# Patient Record
Sex: Female | Born: 1970
Health system: Southern US, Community
[De-identification: ages and names within clinical notes are randomized; demographics above are authoritative.]

## PROBLEM LIST (undated history)

## (undated) DIAGNOSIS — D6861 Antiphospholipid syndrome: Secondary | ICD-10-CM

## (undated) DIAGNOSIS — G4730 Sleep apnea, unspecified: Secondary | ICD-10-CM

## (undated) DIAGNOSIS — I639 Cerebral infarction, unspecified: Secondary | ICD-10-CM

## (undated) DIAGNOSIS — M797 Fibromyalgia: Secondary | ICD-10-CM

## (undated) DIAGNOSIS — Z765 Malingerer [conscious simulation]: Secondary | ICD-10-CM

## (undated) DIAGNOSIS — F319 Bipolar disorder, unspecified: Secondary | ICD-10-CM

## (undated) HISTORY — PX: TONSILLECTOMY: SUR1361

## (undated) HISTORY — PX: TUBAL LIGATION: SHX77

---

## 1997-11-23 ENCOUNTER — Encounter: Admission: RE | Admit: 1997-11-23 | Discharge: 1998-02-21 | Payer: Self-pay | Admitting: *Deleted

## 1998-01-16 ENCOUNTER — Encounter: Admission: RE | Admit: 1998-01-16 | Discharge: 1998-04-16 | Payer: Self-pay | Admitting: Orthopedic Surgery

## 1998-04-10 ENCOUNTER — Encounter: Admission: RE | Admit: 1998-04-10 | Discharge: 1998-07-09 | Payer: Self-pay | Admitting: Orthopedic Surgery

## 1999-03-08 ENCOUNTER — Encounter: Admission: RE | Admit: 1999-03-08 | Discharge: 1999-03-22 | Payer: Self-pay | Admitting: *Deleted

## 1999-09-25 ENCOUNTER — Encounter: Payer: Self-pay | Admitting: Emergency Medicine

## 1999-09-25 ENCOUNTER — Emergency Department (HOSPITAL_COMMUNITY): Admission: EM | Admit: 1999-09-25 | Discharge: 1999-09-25 | Payer: Self-pay | Admitting: Emergency Medicine

## 2000-03-05 ENCOUNTER — Encounter: Admission: RE | Admit: 2000-03-05 | Discharge: 2000-03-05 | Payer: Self-pay | Admitting: Family Medicine

## 2000-03-05 ENCOUNTER — Encounter: Payer: Self-pay | Admitting: Family Medicine

## 2000-06-12 ENCOUNTER — Encounter: Admission: RE | Admit: 2000-06-12 | Discharge: 2000-07-10 | Payer: Self-pay | Admitting: Podiatry

## 2000-08-10 ENCOUNTER — Encounter: Admission: RE | Admit: 2000-08-10 | Discharge: 2000-11-08 | Payer: Self-pay | Admitting: Podiatry

## 2000-12-11 ENCOUNTER — Encounter: Payer: Self-pay | Admitting: *Deleted

## 2000-12-11 ENCOUNTER — Encounter: Admission: RE | Admit: 2000-12-11 | Discharge: 2000-12-11 | Payer: Self-pay | Admitting: *Deleted

## 2001-03-30 ENCOUNTER — Ambulatory Visit (HOSPITAL_COMMUNITY): Admission: RE | Admit: 2001-03-30 | Discharge: 2001-03-30 | Payer: Self-pay | Admitting: *Deleted

## 2001-03-30 ENCOUNTER — Encounter: Payer: Self-pay | Admitting: *Deleted

## 2001-05-09 ENCOUNTER — Emergency Department (HOSPITAL_COMMUNITY): Admission: EM | Admit: 2001-05-09 | Discharge: 2001-05-09 | Payer: Self-pay | Admitting: Emergency Medicine

## 2001-10-01 ENCOUNTER — Encounter: Admission: RE | Admit: 2001-10-01 | Discharge: 2001-10-01 | Payer: Self-pay | Admitting: Family Medicine

## 2001-10-01 ENCOUNTER — Encounter: Payer: Self-pay | Admitting: Family Medicine

## 2002-03-18 ENCOUNTER — Encounter: Admission: RE | Admit: 2002-03-18 | Discharge: 2002-03-18 | Payer: Self-pay | Admitting: Family Medicine

## 2002-03-18 ENCOUNTER — Encounter: Payer: Self-pay | Admitting: Family Medicine

## 2005-04-30 ENCOUNTER — Ambulatory Visit: Payer: Self-pay | Admitting: Psychiatry

## 2005-04-30 ENCOUNTER — Inpatient Hospital Stay (HOSPITAL_COMMUNITY): Admission: RE | Admit: 2005-04-30 | Discharge: 2005-05-05 | Payer: Self-pay | Admitting: Psychiatry

## 2005-11-23 ENCOUNTER — Emergency Department (HOSPITAL_COMMUNITY): Admission: EM | Admit: 2005-11-23 | Discharge: 2005-11-23 | Payer: Self-pay | Admitting: Family Medicine

## 2006-02-02 ENCOUNTER — Other Ambulatory Visit: Admission: RE | Admit: 2006-02-02 | Discharge: 2006-02-02 | Payer: Self-pay | Admitting: Family Medicine

## 2006-04-17 ENCOUNTER — Emergency Department (HOSPITAL_COMMUNITY): Admission: EM | Admit: 2006-04-17 | Discharge: 2006-04-17 | Payer: Self-pay | Admitting: Emergency Medicine

## 2007-11-05 ENCOUNTER — Ambulatory Visit (HOSPITAL_COMMUNITY): Admission: RE | Admit: 2007-11-05 | Discharge: 2007-11-05 | Payer: Self-pay | Admitting: Orthopedic Surgery

## 2008-07-30 ENCOUNTER — Emergency Department (HOSPITAL_COMMUNITY): Admission: EM | Admit: 2008-07-30 | Discharge: 2008-07-30 | Payer: Self-pay | Admitting: Emergency Medicine

## 2009-08-31 ENCOUNTER — Emergency Department (HOSPITAL_COMMUNITY): Admission: EM | Admit: 2009-08-31 | Discharge: 2009-08-31 | Payer: Self-pay | Admitting: Family Medicine

## 2009-11-19 ENCOUNTER — Ambulatory Visit: Payer: Self-pay | Admitting: Family Medicine

## 2009-12-12 ENCOUNTER — Encounter (INDEPENDENT_AMBULATORY_CARE_PROVIDER_SITE_OTHER): Payer: Self-pay | Admitting: Family Medicine

## 2009-12-12 ENCOUNTER — Ambulatory Visit: Payer: Self-pay | Admitting: Internal Medicine

## 2009-12-12 LAB — CONVERTED CEMR LAB
Alkaline Phosphatase: 42 units/L (ref 39–117)
BUN: 21 mg/dL (ref 6–23)
Basophils Absolute: 0 10*3/uL (ref 0.0–0.1)
Basophils Relative: 0 % (ref 0–1)
Cholesterol: 225 mg/dL — ABNORMAL HIGH (ref 0–200)
Creatinine, Ser: 0.87 mg/dL (ref 0.40–1.20)
Eosinophils Absolute: 0.4 10*3/uL (ref 0.0–0.7)
Glucose, Bld: 84 mg/dL (ref 70–99)
HDL: 57 mg/dL (ref >39)
Hemoglobin: 13 g/dL (ref 12.0–15.0)
LDL Cholesterol: 142 mg/dL — ABNORMAL HIGH (ref 0–99)
MCHC: 32.3 g/dL (ref 30.0–36.0)
MCV: 97.3 fL (ref 78.0–100.0)
Monocytes Absolute: 0.6 10*3/uL (ref 0.1–1.0)
Neutro Abs: 4.7 10*3/uL (ref 1.7–7.7)
RDW: 13.8 % (ref 11.5–15.5)
Sodium: 136 meq/L (ref 135–145)
Total Bilirubin: 0.2 mg/dL — ABNORMAL LOW (ref 0.3–1.2)
Total CHOL/HDL Ratio: 3.9
Total Protein: 6.5 g/dL (ref 6.0–8.3)
Triglycerides: 129 mg/dL (ref <150)
VLDL: 26 mg/dL (ref 0–40)

## 2009-12-20 ENCOUNTER — Ambulatory Visit: Payer: Self-pay | Admitting: Internal Medicine

## 2010-12-12 ENCOUNTER — Emergency Department (HOSPITAL_COMMUNITY)
Admission: EM | Admit: 2010-12-12 | Discharge: 2010-12-12 | Disposition: A | Discharge status: Home / Self Care | Payer: Self-pay | Attending: Emergency Medicine | Admitting: Emergency Medicine

## 2010-12-12 ENCOUNTER — Emergency Department (HOSPITAL_COMMUNITY): Payer: Self-pay

## 2010-12-12 DIAGNOSIS — S92309A Fracture of unspecified metatarsal bone(s), unspecified foot, initial encounter for closed fracture: Secondary | ICD-10-CM | POA: Insufficient documentation

## 2010-12-12 DIAGNOSIS — Y92009 Unspecified place in unspecified non-institutional (private) residence as the place of occurrence of the external cause: Secondary | ICD-10-CM | POA: Insufficient documentation

## 2010-12-12 DIAGNOSIS — W108XXA Fall (on) (from) other stairs and steps, initial encounter: Secondary | ICD-10-CM | POA: Insufficient documentation

## 2010-12-12 DIAGNOSIS — M79609 Pain in unspecified limb: Secondary | ICD-10-CM | POA: Insufficient documentation

## 2011-01-07 NOTE — Op Note (Signed)
NAME:  Andrea [name], Andrea [name]               ACCOUNT NO.:  [id number]   MEDICAL RECORD NO.:  [id number]          PATIENT TYPE:  AMB   LOCATION:  SDS                          FACILITY:  [facility]   PHYSICIAN:  Artist Pais. Weingold, M.D.DATE OF BIRTH:  November 30, 1970   DATE OF PROCEDURE:  11/05/2007  DATE OF DISCHARGE:                               OPERATIVE REPORT   PREOPERATIVE DIAGNOSIS:  Left thumb mucous cyst.   POSTOPERATIVE DIAGNOSIS:  Left thumb mucous cyst.   PROCEDURE:  1. Left thumb mucous cyst excision.  2. Dorsal osteophyte removal.  3. Interphalangeal joint, joint debridement.   SURGEON:  Artist Pais. Mina Marble, M.D.   ASSISTANT:  None.   ANESTHESIA:  General.   TOURNIQUET TIME:  26 minutes.   COMPLICATIONS:  None.   DRAINS:  None.   OPERATIVE REPORT:  The patient taken to the operating suite after the  induction of adequate general anesthesia, left upper extremity is  prepped and draped in the usual sterile fashion.  An Esmarch was used to  exsanguinate the limb.  Tourniquet was then inflated to 250 mmHg.  At  this point in time, a midline incision was made over the IP joint left  thumb.  The skin was incised.  Dissection was carried down to level of  the extensor pollicis longus tendon.  This was carefully incised on the  radial aspect.  A large dorsal osteophyte was seen.  The tendon was  retracted to the ulnar side, and the radial-side of the interphalangeal  joint was debrided, including removal of a large osteophyte.  The same  procedure was done on the other side of the joint with the tendon  retracted to the other side.   At the end of the procedure all dorsal osteophytes were removed.  The  wound was debrided and cleared of any debris, and was loosely closed  with 4-0 Vicryl Rapide in a subcuticular stitch.  Xeroform, 4x4s, and a  volar splint was applied.  The patient tolerated the procedure well and  returned to the recovery room in a stable fashion.     Artist Pais Mina Marble, M.D.  Electronically Signed    MAW/MEDQ  D:  11/05/2007  T:  11/06/2007  Job:  [id number]

## 2011-01-10 NOTE — Discharge Summary (Signed)
[name], [name] NO.:  [id number]   MEDICAL RECORD NO.:  [id number]          PATIENT TYPE:  IPS   LOCATION:  0305                          [facility]   PHYSICIAN:  Anselm Jungling, MD  DATE OF BIRTH:  [birth year]   DATE OF ADMISSION:  04/30/2005  DATE OF DISCHARGE:  05/05/2005                                 DISCHARGE SUMMARY   IDENTIFYING DATA AND REASON FOR ADMISSION:  This 40 year old female was  admitted with depression and cocaine abuse.  She was also reported to have  had passive suicidal ideation prior to admission.  Please refer to the  admission note for further details pertaining to the symptoms,  circumstances, and history that led to her admission to [facility].   The patient presented as a moderately disheveled woman who was guarded and  was reluctant to answer questions or make eye contact during the initial  interview.  She indicated that she only wanted to sleep.  She appeared to be  without psychotic symptoms or symptoms of delirium.  Her initial Axis I  diagnosis was depression not otherwise specified and cocaine dependence.   She came to [place] on a regimen of Neurontin 300 mg daily, Wellbutrin XL 150 mg  daily, and Xanax XR 2 mg q.h.s.   MEDICAL AND LABORATORY:  The patient is normally followed by [facility], seen by Dr. Lupe Carney.  Her pertinent medical history included  a possible CVA in 1994 secondary to systemic lupus erythematosus.  She also  had a history of fibromyalgia.  Admission laboratory included a CBC which  was notable for mildly elevated WBCs at 11,800.  A routine chemistry panel  and TSH were within normal limits.  Hepatitis screens were negative.  Urine  pregnancy was negative.  A urine toxicology screen was positive for  methadone and benzodiazepines and cocaine metabolites.  Urinalysis was  within normal limits.   HOSPITAL COURSE:  The patient was admitted to the adult inpatient service  where she  participated in various therapeutic groups, activities and classes  designed to help her acquire better coping skills, a better understanding of  her underlying disorders and dynamics, and the development of an aftercare  plan.  The patient continued quite depressed during her inpatient stay.  On  the third hospital day, she reported that she had learned that her boyfriend  had become involved with her best friend.  She was devastated by this.   The patient had various physical complaints referable to her history of  fibromyalgia, and also demonstrated some mild bilateral ankle edema.  She  continued depressed but had no active thoughts of suicide or self harm.  Her  level of participation was relatively poor, as was her general investment in  treatment.   AFTERCARE PLANS:  The patient was discharged on the 6th hospital day.  She  was denying any thoughts of suicide or self harm at that time.  The patient  had [facility] on May 07, [facility].  Also, she was to follow up with the [facility] on  September 12.  She was instructed on how to contact them on a walk-in basis  through their crisis emergency service.  She was discharged to [facility]  for residential services.  She was to use [facility] for any medical needs.   DISCHARGE MEDICATIONS:  Wellbutrin XL 150 mg daily, Neurontin 300 mg daily,  Tegretol 200 mg daily, and the patient was to resume pain medications as  prescribed by Dr. Clovis Riley.   DISCHARGE DIAGNOSES:  AXIS I:  Depressive disorder not otherwise specified,  history of cocaine abuse.  AXIS II:  Deferred.  AXIS III:  History of fibromyalgia.  AXIS IV:  Stressors, severe.  AXIS V:  Global assessment of function on discharge 60.           ______________________________  Anselm Jungling, MD  Electronically Signed     SPB/MEDQ  D:  05/23/2005  T:  05/23/2005  Job:  [id number]
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
## 2011-05-19 LAB — CBC
HCT: 38.4
Hemoglobin: 13.1
MCHC: 34.2
MCV: 93.5
Platelets: 344
RBC: 4.1
RDW: 12.7
WBC: 9.8

## 2011-05-29 LAB — CBC
Hemoglobin: 13.6 g/dL (ref 12.0–15.0)
RBC: 4.23 MIL/uL (ref 3.87–5.11)
WBC: 9.4 10*3/uL (ref 4.0–10.5)

## 2011-05-29 LAB — COMPREHENSIVE METABOLIC PANEL
ALT: 13 U/L (ref 0–35)
AST: 5 U/L (ref 0–37)
Albumin: 3.4 g/dL — ABNORMAL LOW (ref 3.5–5.2)
Alkaline Phosphatase: 52 U/L (ref 39–117)
BUN: 10 mg/dL (ref 6–23)
CO2: 21 mEq/L (ref 19–32)
Calcium: 8.8 mg/dL (ref 8.4–10.5)
Chloride: 107 mEq/L (ref 96–112)
Creatinine, Ser: 0.87 mg/dL (ref 0.4–1.2)
GFR calc Af Amer: >60 mL/min (ref >60)
GFR calc non Af Amer: >60 mL/min (ref >60)
Glucose, Bld: 95 mg/dL (ref 70–99)
Potassium: 3.8 mEq/L (ref 3.5–5.1)
Sodium: 135 mEq/L (ref 135–145)
Total Bilirubin: 0.2 mg/dL — ABNORMAL LOW (ref 0.3–1.2)
Total Protein: 6.4 g/dL (ref 6.0–8.3)

## 2011-05-29 LAB — URINALYSIS, ROUTINE W REFLEX MICROSCOPIC
Bilirubin Urine: NEGATIVE
Glucose, UA: NEGATIVE mg/dL
Ketones, ur: NEGATIVE mg/dL
Leukocytes, UA: NEGATIVE
Nitrite: NEGATIVE
Protein, ur: NEGATIVE mg/dL
Specific Gravity, Urine: 1.003 — ABNORMAL LOW (ref 1.005–1.030)
Urobilinogen, UA: 0.2 mg/dL (ref 0.0–1.0)
pH: 6 (ref 5.0–8.0)

## 2011-05-29 LAB — DIFFERENTIAL
Lymphs Abs: 2.7 10*3/uL (ref 0.7–4.0)
Monocytes Absolute: 0.5 10*3/uL (ref 0.1–1.0)
Monocytes Relative: 6 % (ref 3–12)
Neutro Abs: 6 10*3/uL (ref 1.7–7.7)
Neutrophils Relative %: 65 % (ref 43–77)

## 2011-05-29 LAB — WET PREP, GENITAL
Clue Cells Wet Prep HPF POC: NONE SEEN
Trich, Wet Prep: NONE SEEN
Yeast Wet Prep HPF POC: NONE SEEN

## 2011-05-29 LAB — POCT I-STAT, CHEM 8
BUN: 10 mg/dL (ref 6–23)
Calcium, Ion: 1.11 mmol/L — ABNORMAL LOW (ref 1.12–1.32)
Chloride: 106 mEq/L (ref 96–112)
Creatinine, Ser: 0.8 mg/dL (ref 0.4–1.2)
Glucose, Bld: 95 mg/dL (ref 70–99)
Potassium: 3.9 mEq/L (ref 3.5–5.1)

## 2011-05-29 LAB — URINE MICROSCOPIC-ADD ON

## 2011-05-29 LAB — GC/CHLAMYDIA PROBE AMP, GENITAL
Chlamydia, DNA Probe: NEGATIVE
GC Probe Amp, Genital: NEGATIVE

## 2011-05-29 LAB — POCT PREGNANCY, URINE: Preg Test, Ur: NEGATIVE

## 2017-04-08 ENCOUNTER — Emergency Department (HOSPITAL_COMMUNITY)
Admission: EM | Admit: 2017-04-08 | Discharge: 2017-04-08 | Disposition: A | Discharge status: Home / Self Care | Payer: Self-pay | Attending: Emergency Medicine | Admitting: Emergency Medicine

## 2017-04-08 ENCOUNTER — Encounter (HOSPITAL_COMMUNITY): Payer: Self-pay

## 2017-04-08 ENCOUNTER — Emergency Department (HOSPITAL_COMMUNITY): Payer: Self-pay

## 2017-04-08 DIAGNOSIS — I69354 Hemiplegia and hemiparesis following cerebral infarction affecting left non-dominant side: Secondary | ICD-10-CM | POA: Insufficient documentation

## 2017-04-08 DIAGNOSIS — F17210 Nicotine dependence, cigarettes, uncomplicated: Secondary | ICD-10-CM | POA: Insufficient documentation

## 2017-04-08 DIAGNOSIS — M25572 Pain in left ankle and joints of left foot: Secondary | ICD-10-CM | POA: Insufficient documentation

## 2017-04-08 HISTORY — DX: Cerebral infarction, unspecified: I63.9

## 2017-04-08 HISTORY — DX: Antiphospholipid syndrome: D68.61

## 2017-04-08 HISTORY — DX: Bipolar disorder, unspecified: F31.9

## 2017-04-08 HISTORY — DX: Fibromyalgia: M79.7

## 2017-04-08 MED ORDER — NAPROXEN 500 MG PO TABS: 500.0000 mg | ORAL_TABLET | Freq: Two times a day (BID) | ORAL | 0 refills | Status: DC

## 2017-04-08 MED ORDER — HYDROCODONE-ACETAMINOPHEN 5-325 MG PO TABS
1.0000 | ORAL_TABLET | Freq: Once | ORAL | Status: AC
  Administered 2017-04-08: 1 via ORAL
  Filled 2017-04-08: qty 1

## 2017-04-08 NOTE — ED Provider Notes (Signed)
[facility]-EMERGENCY DEPT Provider Note   CSN: [id number] Arrival date & time: 04/08/17  1332     History   Chief Complaint Chief Complaint  Patient presents with  . Ankle Pain    HPI Andrea [name] is a 46 y.o. female.  HPI  Patient, with a past medical history of fibromyalgia, prior stroke, presents to ED for evaluation of left ankle pain for the past 2 days. Pain worse with weightbearing and better with warm compresses. She is unsure if she injured the ankle but she did say she did trip and fall when symptoms began. She has a history of stroke with left-sided deficits which have improved over the years. Since then she is unsure about any injuries to the left side. She had tried ibuprofen with some relief in her symptoms. She denies any previous fracture, dislocation or procedures done in the area. She denies any fevers, chills, nausea, vomiting.  Past Medical History:  Diagnosis Date  . Anticardiolipin antibody syndrome (HCC)   . Bipolar affective (HCC)   . Fibromyalgia   . Stroke [facility])    L sided weakness    There are no active problems to display for this patient.   Past Surgical History:  Procedure Laterality Date  . CESAREAN SECTION    . TONSILLECTOMY      OB History    No data available       Home Medications    Prior to Admission medications   Not on File    Family History No family history on file.  Social History Social History  Substance Use Topics  . Smoking status: Current Every Day Smoker    Packs/day: 1.00    Types: Cigarettes  . Smokeless tobacco: Never Used  . Alcohol use Yes     Comment: occ     Allergies   Sulfa antibiotics   Review of Systems Review of Systems  Constitutional: Negative for chills and fever.  Gastrointestinal: Negative for nausea and vomiting.  Musculoskeletal: Positive for arthralgias. Negative for myalgias.  Skin: Negative for color change and rash.  Neurological: Negative for weakness and numbness.      Physical Exam Updated Vital Signs BP 116/76 (BP Location: Right Arm)   Pulse 69   Temp 97.9 F (36.6 C) (Oral)   Resp 18   Ht 5\' 5"  (1.651 m)   Wt 90.7 kg (200 lb)   LMP 04/08/2017   SpO2 96%   BMI 33.28 kg/m   Physical Exam  Constitutional: She appears well-developed and well-nourished. No distress.  HENT:  Head: Normocephalic and atraumatic.  Eyes: Conjunctivae and EOM are normal. No scleral icterus.  Neck: Normal range of motion.  Pulmonary/Chest: Effort normal. No respiratory distress.  Musculoskeletal: Normal range of motion. She exhibits tenderness. She exhibits no edema or deformity.       Feet:  Tenderness to palpation in the indicated areas. Pain with inversion and eversion of the ankle. Sensation intact to light touch. 2+ DP pulse. Strength 5/5 in lower extremity.  Neurological: She is alert.  Skin: No rash noted. She is not diaphoretic.  Psychiatric: She has a normal mood and affect.  Nursing note and vitals reviewed.    ED Treatments / Results  Labs (all labs ordered are listed, but only abnormal results are displayed) Labs Reviewed - No data to display  EKG  EKG Interpretation None       Radiology Dg Ankle Complete Left  Result Date: 04/08/2017 CLINICAL DATA:  LEFT foot pain.  Medial LEFT ankle pain EXAM: LEFT ANKLE COMPLETE - 3+ VIEW COMPARISON:  None. FINDINGS: Ankle mortise intact. The talar dome is normal. No malleolar fracture. The calcaneus is normal. Enthesopathic spurring along the plantar aspect of the calcaneus. IMPRESSION: No fracture or dislocation. Electronically Signed   By: Genevive Bi M.D.   On: 04/08/2017 15:54   Dg Foot Complete Left  Result Date: 04/08/2017 CLINICAL DATA:  Lateral foot pain.  No known injury. EXAM: LEFT FOOT - COMPLETE 3+ VIEW COMPARISON:  12/12/2010 FINDINGS: There is no acute fracture or dislocation. Old healed fracture of the proximal fifth metatarsal. Minimal degenerative changes of the first MTP  joint. Tiny plantar calcaneal enthesophyte. Slight dorsal spurring of talus and in the midfoot. IMPRESSION: No acute abnormalities. Electronically Signed   By: Francene Boyers M.D.   On: 04/08/2017 15:52    Procedures Procedures (including critical care time)  Medications Ordered in ED Medications  HYDROcodone-acetaminophen (NORCO/VICODIN) 5-325 MG per tablet 1 tablet (not administered)     Initial Impression / Assessment and Plan / ED Course  I have reviewed the triage vital signs and the nursing notes.  Pertinent labs & imaging results that were available during my care of the patient were reviewed by me and considered in my medical decision making (see chart for details).     Patient presents to ED for evaluation of left ankle and foot pain that began 2 days ago. She is unsure if she twisted the ankle but states that she did trip for symptoms began. She has a history of deficits on the left side due to a stroke many years ago. No prior fracture, dislocation or procedure done in this area. On physical exam there is tenderness to palpation in the indicated area as noted above. She does have pain with inversion and eversion of the ankle. There is no visible swelling, color temperature change noted. Low suspicion for septic joint given the cause of her pain based on the history, symptoms and the fact that she is nontoxic-appearing and is able to perform range of motion of the ankle. She is able to ambulate though with a limp. Will obtain x-rays of ankle and foot and reassess.  1555: X-rays of foot and ankle returned as negative for fracture dislocation. Pain control here in the ED. Will give ankle ASO, and naproxen for home. We'll refer to specialist for further evaluation if symptoms persist. Patient appears stable for discharge at this time. Strict return precautions given.  Final Clinical Impressions(s) / ED Diagnoses   Final diagnoses:  Acute left ankle pain    New Prescriptions New  Prescriptions   No medications on file     Dietrich Pates, PA-C 04/08/17 1608    Arby Barrette, MD 04/16/17 [phone]

## 2017-04-08 NOTE — Progress Notes (Signed)
Orthopedic Tech Progress Note Patient Details:  Andrea [name] [birth year] [id number]  Ortho Devices Type of Ortho Device: ASO Ortho Device/Splint Location: Left Ortho Device/Splint Interventions: Application, Adjustment   [name] 04/08/2017, 4:26 PM

## 2017-04-08 NOTE — ED Triage Notes (Signed)
Pt states she twisted L ankle 2 days ago.  Now c/o pain with weight bearing to L ankle.

## 2017-04-08 NOTE — Discharge Instructions (Signed)
Please read attached information regarding your condition and heat therapy. Take Naprosyn as directed for inflammation. Wear ankle brace and use crutches as needed. Bear weight as tolerated. Follow up with foot specialist listed below for further evaluation. Return to ED for worsening pain, numbness, injury, falls, fevers, signs of infected joint including swelling and redness.

## 2017-04-12 ENCOUNTER — Emergency Department (HOSPITAL_COMMUNITY): Payer: Self-pay

## 2017-04-12 ENCOUNTER — Encounter (HOSPITAL_COMMUNITY): Payer: Self-pay | Admitting: Emergency Medicine

## 2017-04-12 ENCOUNTER — Emergency Department (HOSPITAL_COMMUNITY)
Admission: EM | Admit: 2017-04-12 | Discharge: 2017-04-12 | Disposition: A | Discharge status: Home / Self Care | Payer: Self-pay | Attending: Emergency Medicine | Admitting: Emergency Medicine

## 2017-04-12 DIAGNOSIS — X509XXA Other and unspecified overexertion or strenuous movements or postures, initial encounter: Secondary | ICD-10-CM | POA: Insufficient documentation

## 2017-04-12 DIAGNOSIS — Y9301 Activity, walking, marching and hiking: Secondary | ICD-10-CM | POA: Insufficient documentation

## 2017-04-12 DIAGNOSIS — Y999 Unspecified external cause status: Secondary | ICD-10-CM | POA: Insufficient documentation

## 2017-04-12 DIAGNOSIS — M79672 Pain in left foot: Secondary | ICD-10-CM | POA: Insufficient documentation

## 2017-04-12 DIAGNOSIS — Y92009 Unspecified place in unspecified non-institutional (private) residence as the place of occurrence of the external cause: Secondary | ICD-10-CM | POA: Insufficient documentation

## 2017-04-12 DIAGNOSIS — F17210 Nicotine dependence, cigarettes, uncomplicated: Secondary | ICD-10-CM | POA: Insufficient documentation

## 2017-04-12 HISTORY — DX: Malingerer (conscious simulation): Z76.5

## 2017-04-12 MED ORDER — OXYCODONE-ACETAMINOPHEN 5-325 MG PO TABS
1.0000 | ORAL_TABLET | Freq: Once | ORAL | Status: AC
  Administered 2017-04-12: 1 via ORAL
  Filled 2017-04-12: qty 1

## 2017-04-12 NOTE — ED Triage Notes (Signed)
Pt presents to ED for assessment of left foot after being seen here Wednesday for the same.  Patient states she reinjured it today, and is no unable to bear any weight on it.  C/o swelling.  Sensation and pulses intact.

## 2017-04-12 NOTE — ED Provider Notes (Signed)
[facility]-EMER[facility]ENCY DEPT Provider Note   CSN: [id number] Arrival date & time: 04/12/17  1924     History   Chief Complaint Chief Complaint  Patient presents with  . Foot Pain    HPI Andrea [name] is a 46 y.o. female.  HPI Patient presents to ED for evaluation of L foot pain. She states she was walking to her bathroom last night when she heard a pop in her foot. Since then she has had pain with weightbearing. She was seen here in the ED 3 days ago for similar symptoms and states that symptoms had improved until last night when she felt the pop. She denies any falls, previous history of fracture or dislocation, head injury, numbness, wounds.  Past Medical History:  Diagnosis Date  . Anticardiolipin antibody syndrome (HCC)   . Bipolar affective (HCC)   . Fibromyalgia   . Stroke [facility])    L sided weakness    There are no active problems to display for this patient.   Past Surgical History:  Procedure Laterality Date  . CESAREAN SECTION    . TONSILLECTOMY      OB History    No data available       Home Medications    Prior to Admission medications   Medication Sig Start Date End Date Taking? Authorizing Provider  naproxen (NAPROSYN) 500 MG tablet Take 1 tablet (500 mg total) by mouth 2 (two) times daily. 04/08/17   Dietrich Pates, PA-C    Family History History reviewed. No pertinent family history.  Social History Social History  Substance Use Topics  . Smoking status: Current Every Day Smoker    Packs/day: 1.00    Types: Cigarettes  . Smokeless tobacco: Never Used  . Alcohol use Yes     Comment: occ     Allergies   Sulfa antibiotics   Review of Systems Review of Systems  Constitutional: Negative for chills and fever.  Gastrointestinal: Negative for nausea and vomiting.  Musculoskeletal: Positive for arthralgias and myalgias.  Skin: Negative for color change and wound.     Physical Exam Updated Vital Signs BP (!) 154/95 (BP Location: Right Arm)    Pulse 84   Temp 98.1 F (36.7 C) (Oral)   Resp 18   LMP 04/08/2017   SpO2 98%   Physical Exam  Constitutional: She appears well-developed and well-nourished. No distress.  HENT:  Head: Normocephalic and atraumatic.  Eyes: Conjunctivae and EOM are normal. No scleral icterus.  Neck: Normal range of motion.  Pulmonary/Chest: Effort normal. No respiratory distress.  Musculoskeletal: Normal range of motion. She exhibits tenderness. She exhibits no edema or deformity.       Feet:  Tenderness to palpation on the left lateral foot as indicated in the image. Full active and passive range of motion at the ankle and digits. No visible swelling noted. No color or temperature change noted. No wounds noted. 2+ DP pulse. Sensation intact to light touch.  Neurological: She is alert.  Skin: No rash noted. She is not diaphoretic.  Psychiatric: She has a normal mood and affect.  Nursing note and vitals reviewed.    ED Treatments / Results  Labs (all labs ordered are listed, but only abnormal results are displayed) Labs Reviewed - No data to display  EKG  EKG Interpretation None       Radiology Dg Foot Complete Left  Result Date: 04/12/2017 CLINICAL DATA:  Initial injury 4 days ago with recurrent injury today with pain, initial encounter  EXAM: LEFT FOOT - COMPLETE 3+ VIEW COMPARISON:  04/08/2017, 12/12/2010 FINDINGS: Tarsal degenerative changes are again seen as well as calcaneal spurring. Some cortical thickening is noted in the proximal fifth metatarsal consistent with a healed fracture. No new acute fracture is noted. No gross soft tissue abnormality is seen. IMPRESSION: Healed fifth metatarsal fracture. No acute abnormality is noted. Electronically Signed   By: Alcide Clever M.D.   On: 04/12/2017 19:55    Procedures Procedures (including critical care time)  Medications Ordered in ED Medications  oxyCODONE-acetaminophen (PERCOCET/ROXICET) 5-325 MG per tablet 1 tablet (not  administered)     Initial Impression / Assessment and Plan / ED Course  I have reviewed the triage vital signs and the nursing notes.  Pertinent labs & imaging results that were available during my care of the patient were reviewed by me and considered in my medical decision making (see chart for details).     Patient presents to ED for evaluation of left foot pain that began last night after she heard a pop while walking to the bathroom. She reports pain on the left lateral side of her foot worse with weightbearing. She denies any head injuries or loss of consciousness. She reports that symptoms were improving with supportive measures and after her previous visit 3 days ago. However she reports since the pop that she heard that symptoms have worsened. Physical exam there is tenderness to palpation on the left lateral side of the foot. There is no visible swelling, color temperature change noted.she has full active and passive range of motion of the ankle and digits given the low suspicion for septic joint.  X-ray was negative for fracture or dislocation. Gave patient the option of postop shoe, Walker or splint here in the ED. She elected to try the post op shoe. She also would like a walker because she does not feel stable with the crutches that she has at home. Pain controlled here in the ED. Will provide patient with postop shoe and walker. Advised to continue naproxen or Tylenol as seizures for pain. Patient appears stable for discharge at this time. Strict return precautions given.  Final Clinical Impressions(s) / ED Diagnoses   Final diagnoses:  Foot pain, left    New Prescriptions New Prescriptions   No medications on file     Dietrich Pates, Cordelia Poche 04/12/17 2029    Raeford Razor, MD 04/21/17 [phone]

## 2017-04-12 NOTE — Discharge Instructions (Signed)
Please read attached information regarding your condition. Used post op shoe and walker as directed. Continue Aleve for pain and inflammation. Return to ED for additional injury, numbness, weakness, head injuries,color or temperature changes of joints.

## 2017-12-01 ENCOUNTER — Other Ambulatory Visit: Payer: Self-pay

## 2017-12-01 ENCOUNTER — Encounter (INDEPENDENT_AMBULATORY_CARE_PROVIDER_SITE_OTHER): Payer: Self-pay

## 2017-12-01 ENCOUNTER — Ambulatory Visit (INDEPENDENT_AMBULATORY_CARE_PROVIDER_SITE_OTHER): Payer: Self-pay | Admitting: Internal Medicine

## 2017-12-01 DIAGNOSIS — M519 Unspecified thoracic, thoracolumbar and lumbosacral intervertebral disc disorder: Secondary | ICD-10-CM

## 2017-12-01 DIAGNOSIS — F319 Bipolar disorder, unspecified: Secondary | ICD-10-CM

## 2017-12-01 DIAGNOSIS — F418 Other specified anxiety disorders: Secondary | ICD-10-CM

## 2017-12-01 DIAGNOSIS — F419 Anxiety disorder, unspecified: Secondary | ICD-10-CM

## 2017-12-01 DIAGNOSIS — F332 Major depressive disorder, recurrent severe without psychotic features: Secondary | ICD-10-CM

## 2017-12-01 DIAGNOSIS — Z818 Family history of other mental and behavioral disorders: Secondary | ICD-10-CM

## 2017-12-01 DIAGNOSIS — F1290 Cannabis use, unspecified, uncomplicated: Secondary | ICD-10-CM

## 2017-12-01 DIAGNOSIS — Z79899 Other long term (current) drug therapy: Secondary | ICD-10-CM

## 2017-12-01 DIAGNOSIS — M797 Fibromyalgia: Secondary | ICD-10-CM

## 2017-12-01 DIAGNOSIS — Z8659 Personal history of other mental and behavioral disorders: Secondary | ICD-10-CM

## 2017-12-01 DIAGNOSIS — Z8673 Personal history of transient ischemic attack (TIA), and cerebral infarction without residual deficits: Secondary | ICD-10-CM

## 2017-12-01 DIAGNOSIS — F1411 Cocaine abuse, in remission: Secondary | ICD-10-CM

## 2017-12-01 DIAGNOSIS — I69354 Hemiplegia and hemiparesis following cerebral infarction affecting left non-dominant side: Secondary | ICD-10-CM

## 2017-12-01 DIAGNOSIS — G4700 Insomnia, unspecified: Secondary | ICD-10-CM

## 2017-12-01 DIAGNOSIS — Z8269 Family history of other diseases of the musculoskeletal system and connective tissue: Secondary | ICD-10-CM

## 2017-12-01 HISTORY — DX: Bipolar disorder, unspecified: F31.9

## 2017-12-01 MED ORDER — AMITRIPTYLINE HCL 25 MG PO TABS: 25.0000 mg | ORAL_TABLET | Freq: Every day | ORAL | 1 refills | Status: DC

## 2017-12-01 NOTE — Progress Notes (Signed)
Case discussed with Dr. Guilloud at the time of the visit.  We reviewed the resident's history and exam and pertinent patient test results.  I agree with the assessment, diagnosis and plan of care documented in the resident's note. 

## 2017-12-01 NOTE — Assessment & Plan Note (Signed)
Self-reported history of CVA in 1994 with residual left-sided weakness secondary to anticardiolipin antibody.  She is not currently on anticoagulation.  She denies any other thrombotic events.  On exam today her strength is intact and symmetric bilaterally.  Once orange card is obtained, consider checking repeat cardiolipin antibody.

## 2017-12-01 NOTE — Assessment & Plan Note (Signed)
Patient reports uncontrolled symptoms of depression and anxiety.  Her PHQ 9 score today is 20, consistent with severe depression.  She denies suicidal ideation.  Given her history of bipolar disorder, we will not initiate therapy today.  Plan for behavioral health referral once orange card is obtained.

## 2017-12-01 NOTE — Progress Notes (Signed)
   CC: Fibromyalgia follow up  HPI:  Ms.Terilyn N Cannan is a 47 y.o. female with past medical history significant for bipolar disorder, depression, fibromyalgia, substance abuse, self-reported history of rheumatoid arthritis and stroke in 1994 secondary to anticardiolipin antibody.  She is currently not on any medications.  She reports previously taking chronic opioids, Xanax, Wellbutrin, and other medications for her depression.  She was diagnosed with bipolar disorder about a year ago, but has not been able to follow-up with behavioral health due to lack of insurance coverage.  Past Medical History:  Diagnosis Date  . Anticardiolipin antibody syndrome (HCC)   . Bipolar affective (HCC)   . Drug-seeking behavior   . Fibromyalgia   . Stroke [facility])    L sided weakness    Review of Systems  Constitutional: Negative for chills and fever.  Eyes: Negative for blurred vision.  Respiratory: Negative for shortness of breath.   Cardiovascular: Negative for chest pain.  Gastrointestinal: Negative for abdominal pain.  Genitourinary: Negative for dysuria.  Musculoskeletal: Negative for joint pain.  Skin: Negative for rash.  Neurological: Negative for sensory change.  Psychiatric/Behavioral: Positive for depression and substance abuse. The patient is nervous/anxious and has insomnia.        Anixety   Family History: Adopted, birth mother with bipolar disorder, maternal grandmother with bipolar. Father unknown. Mother's side with hx of fibromyalgia.   Social History: Smokes 1PDD, started smoking at age 28. Drink alcohol rarely. Smokes marijuana, history of cocaine abuse, last used 14 years ago.   Physical Exam:  Vitals:   12/01/17 0903  BP: 124/62  Pulse: 64  Temp: 97.8 F (36.6 C)  TempSrc: Oral  SpO2: 99%  Weight: 221 lb 6.4 oz (100.4 kg)  Height: 5\' 5"  (1.651 m)   Constitutional: NAD, appears comfortable HEENT: PERRL, Moist mucous membranes Neck: Supple, trachea midline.    Cardiovascular: RRR, no murmurs, rubs, or gallops.  Pulmonary/Chest: CTAB, no wheezes, rales, or rhonchi. Abdominal: Soft, non tender, non distended. +BS.  Extremities: Warm and well perfused. No edema.  Neurological: A&Ox3, CN II - XII grossly intact.  Skin: No rashes or erythema  Psychiatric: Normal mood and affect   Assessment & Plan:   See Encounters Tab for problem based charting.  Patient discussed with Dr. 

## 2017-12-01 NOTE — Assessment & Plan Note (Signed)
Patient reports a history of bipolar disorder.  She is not on a mood stabilizer.  She has been unable to follow-up with behavioral health due to lack of insurance.  In the process of applying for the orange card.  We will refer to behavioral health once this is obtained.

## 2017-12-01 NOTE — Patient Instructions (Signed)
FOLLOW-UP INSTRUCTIONS When: 1 month For: Follow up What to bring: Medications  Andrea [name],  It was a pleasure to meet you. I have sent a prescription for amitriptyline to your [place] pharmacy. Please follow up with [place] in 1 month. If you have any questions or concerns, call our clinic at [phone] or after hours call [phone] and ask for the internal medicine resident on call. Thank you!  - Dr. Antony Contras

## 2017-12-01 NOTE — Assessment & Plan Note (Signed)
Patient reports a prior diagnosis of fibromyalgia.  Today she has a widespread pain index score of 10, with a symptom severity score (SS score) of 7, meeting diagnostic criteria.  She is requesting medication.  Given her history of bipolar disorder, I do not feel comfortable prescribing an SSRI.  She also reports a history of substance abuse, actively using marijuana and a prior history of cocaine abuse.  There is documented concern in her chart for substance abuse/drug seeking behavior.  She reports previously being prescribed oxycodone and fentanyl patches.  Overall opioids are not appropriate in this patient and also not indicated for treatment of fibromyalgia.  We discussed treatment with amitriptyline.  She reports she has been prescribed this medication in the past with some relief.  We will start low-dose, 25 mg nightly.  Up titrate to efficacy.  --Amitriptyline 25 mg QHS --Follow up 1 month

## 2017-12-03 ENCOUNTER — Ambulatory Visit: Payer: Self-pay

## 2018-01-01 ENCOUNTER — Other Ambulatory Visit: Payer: Self-pay

## 2018-01-01 ENCOUNTER — Encounter: Payer: Self-pay | Admitting: Internal Medicine

## 2018-01-01 ENCOUNTER — Ambulatory Visit (INDEPENDENT_AMBULATORY_CARE_PROVIDER_SITE_OTHER): Payer: Self-pay | Admitting: Internal Medicine

## 2018-01-01 VITALS — BP 134/80 | HR 76 | Temp 98.0°F | Ht 65.0 in | Wt 218.1 lb

## 2018-01-01 DIAGNOSIS — F1090 Alcohol use, unspecified, uncomplicated: Secondary | ICD-10-CM

## 2018-01-01 DIAGNOSIS — F418 Other specified anxiety disorders: Secondary | ICD-10-CM

## 2018-01-01 DIAGNOSIS — M797 Fibromyalgia: Secondary | ICD-10-CM

## 2018-01-01 DIAGNOSIS — M069 Rheumatoid arthritis, unspecified: Secondary | ICD-10-CM

## 2018-01-01 DIAGNOSIS — Z79899 Other long term (current) drug therapy: Secondary | ICD-10-CM

## 2018-01-01 DIAGNOSIS — F319 Bipolar disorder, unspecified: Secondary | ICD-10-CM

## 2018-01-01 DIAGNOSIS — Z8673 Personal history of transient ischemic attack (TIA), and cerebral infarction without residual deficits: Secondary | ICD-10-CM

## 2018-01-01 DIAGNOSIS — N921 Excessive and frequent menstruation with irregular cycle: Secondary | ICD-10-CM

## 2018-01-01 DIAGNOSIS — Z7289 Other problems related to lifestyle: Secondary | ICD-10-CM

## 2018-01-01 DIAGNOSIS — R220 Localized swelling, mass and lump, head: Secondary | ICD-10-CM

## 2018-01-01 DIAGNOSIS — Z23 Encounter for immunization: Secondary | ICD-10-CM

## 2018-01-01 MED ORDER — AMITRIPTYLINE HCL 25 MG PO TABS: 25.0000 mg | ORAL_TABLET | Freq: Every day | ORAL | 1 refills | Status: DC

## 2018-01-01 MED ORDER — ASPIRIN EC 81 MG PO TBEC: 81.0000 mg | DELAYED_RELEASE_TABLET | Freq: Every day | ORAL | 2 refills | Status: DC

## 2018-01-01 NOTE — Assessment & Plan Note (Signed)
Was also complaining of irregular heavy menstrual cycle for past few months.  According to patient she soaks many tampons in a day. She was currently having her periods.  -She will need a Pap smear once finished with her cycle. -She was advised to make an appointment for that purpose when she is not bleeding. -We will check CBC to get  baseline for her hemoglobin. -We will send her to gynecologist if needed for her heavy, irregular menstrual cycle.

## 2018-01-01 NOTE — Assessment & Plan Note (Addendum)
Recent has a long list of complaints is likely related to her fibromyalgia. Most of them appears behavioral. According to patient she has an history of rheumatoid arthritis and she was under the care of rheumatologist many years ago because of pain and intermittent swelling of her distal PIP ??  Rheumatoid arthritis does not normally effect distal PIP most likely-OA. No obvious swelling or abnormality noted today.  As amitriptyline helps a little bit-we discussed going up on the dose, she is currently taking 25 mg at bedtime.  According to patient she has tried 50 mg in the past which makes her very sleepy and she do not want to increase her dose at this time.  -Continue amitriptyline 25 mg daily. -She was advised not to take her boyfriend's gabapentin if it is not helping. -She might get benefit with Lyrica-we will try next time if she can qualify for a patient assistant program as she cannot afford it now. -Cymbalta will be beneficial for her because of her history of depression and pain.  We will appreciate recommendation from psychiatrist if she needs a concomitant mood stabilizer. -As she had orange card now we will check her basic labs which include CMP, TSH, lipid profile, ESR and CRP.  Addendum.  Her CBC, CMP, TSH, ESR and CRP is within normal limit. Lipid profile with total cholesterol of 199, Actis trial mildly elevated at 168, HDL 35 and LDL 130.  Her ASCVD risk score is 6.2% patient required moderate intensity statin. Prescription was sent to her pharmacy. Called the patient with no response.

## 2018-01-01 NOTE — Assessment & Plan Note (Signed)
According to patient she has an history of bipolar disorder but not on any mood stabilizer at this time. No recent manic episode.  She was provided with a referral to see a psychiatrist as she has a orange card now.

## 2018-01-01 NOTE — Assessment & Plan Note (Signed)
Her PHQ score was 17 today. I am not comfortable giving her a SSRI because of her history of bipolar.  She was provided with a referral to see a psychiatrist. She has a bipolar she will need a mood stabilizer along with SSRI. SSRI will help with her fibromyalgia pain too.

## 2018-01-01 NOTE — Patient Instructions (Addendum)
Thank you for visiting clinic today. We are checking some labs today, we will call you with any abnormal results. I am giving you a refill for amitriptyline. I am also giving you a referral to see a psychiatrist to help with your depression and bipolar.  Exercise regularly and eat healthy diet.  Keep yourself well-hydrated. Please follow-up in 13-month.

## 2018-01-01 NOTE — Assessment & Plan Note (Signed)
According to patient she has a stroke in 1994 and was told that she had anticardiolipin antibody positive-no documentation. She never had any more thromboembolic symptoms since 1994. She denies any rash.  -We will continue to monitor at this time. -Repeat anti-cardiolipin antibody if needed.

## 2018-01-01 NOTE — Progress Notes (Signed)
   CC: For follow-up of her fibromyalgia and depression.  HPI:  [name] is a 47 y.o. with past medical history as listed below came to the clinic for follow-up of her fibromyalgia and depression.  Fibromyalgia. She was complaining of generalized body pian worse in her neck, bilateral shoulder and lower back, rating it 8 out of 10.  Stating that amitriptyline helps her sleep at night.  She was also using gabapentin 600 mg twice daily from her boyfriend's prescription for last couple of month with no relief.  She also uses ibuprofen 800 mg twice daily and Tylenol 1000 mg twice daily for many years now. She uses marijuana almost daily to help with her pain. She was also complaining of abdominal flatulence and alternating diarrhea and constipation-more consistent with IBS.  Denies any change in her appetite or weight.  No blood in stool. She was also stating that she has a diagnosis of rheumatoid arthritis in her distal PIP??  Mostly complained of pain and intermittent swelling, no stiffness.  Depression.  Her PHQ score today was 17.  She states that she has some good days and bad days.  According to patient she has an history of bipolar disorder but not on any mood stabilizer.  She denies any suicidal ideation or manic episode.  She was asking for a referral to see a psychiatrist as she has orange card now.  Past Medical History:  Diagnosis Date  . Anticardiolipin antibody syndrome (HCC)   . Bipolar affective (HCC)   . Drug-seeking behavior   . Fibromyalgia   . Stroke [facility])    L sided weakness   Review of Systems: Negative except mentioned in HPI.  Physical Exam:  Vitals:   01/01/18 1501  BP: 134/80  Pulse: 76  Temp: 98 F (36.7 C)  TempSrc: Oral  SpO2: 98%  Weight: 218 lb 1.6 oz (98.9 kg)  Height: 5\' 5"  (1.651 m)    General: Vital signs reviewed.  Patient is well-developed and well-nourished, in no acute distress and cooperative with exam.  Head: Normocephalic and  atraumatic. Eyes: EOMI, conjunctivae normal, no scleral icterus.  Neck: Supple, trachea midline, normal ROM, no JVD, small 1-1.5 cm freely mobile mass under the chin consistent with lipoma, no thyromegaly, or carotid bruit present.  Cardiovascular: RRR, S1 normal, S2 normal, no murmurs, gallops, or rubs. Pulmonary/Chest: Clear to auscultation bilaterally, no wheezes, rales, or rhonchi. Abdominal: Soft, non-tender, non-distended, BS +, no masses, organomegaly, or guarding present.  Musculoskeletal: No joint deformities, erythema, or stiffness, ROM full and nontender. Extremities: No lower extremity edema bilaterally,  pulses symmetric and intact bilaterally. No cyanosis or clubbing. Neurological: A&O x3, Strength is normal and symmetric bilaterally, cranial nerve II-XII are grossly intact, no focal motor deficit, sensory intact to light touch bilaterally.  Skin: Warm, dry and intact. No rashes or erythema. Psychiatric: Appears little anxious.  Assessment & Plan:   See Encounters Tab for problem based charting.  Patient discussed with Dr. .

## 2018-01-02 LAB — CBC
HEMATOCRIT: 39.3 % (ref 34.0–46.6)
HEMOGLOBIN: 13 g/dL (ref 11.1–15.9)
MCH: 29.1 pg (ref 26.6–33.0)
MCHC: 33.1 g/dL (ref 31.5–35.7)
MCV: 88 fL (ref 79–97)
Platelets: 346 10*3/uL (ref 150–379)
RBC: 4.47 x10E6/uL (ref 3.77–5.28)
RDW: 15.3 % (ref 12.3–15.4)
WBC: 8.6 10*3/uL (ref 3.4–10.8)

## 2018-01-02 LAB — CMP14 + ANION GAP
ALT: 11 IU/L (ref 0–32)
AST: 14 IU/L (ref 0–40)
Albumin/Globulin Ratio: 1.7 (ref 1.2–2.2)
Albumin: 3.9 g/dL (ref 3.5–5.5)
Alkaline Phosphatase: 47 IU/L (ref 39–117)
Anion Gap: 12 mmol/L (ref 10.0–18.0)
BUN/Creatinine Ratio: 21 (ref 9–23)
BUN: 17 mg/dL (ref 6–24)
Bilirubin Total: <0.2 mg/dL (ref 0.0–1.2)
CALCIUM: 9.6 mg/dL (ref 8.7–10.2)
CO2: 20 mmol/L (ref 20–29)
CREATININE: 0.81 mg/dL (ref 0.57–1.00)
Chloride: 106 mmol/L (ref 96–106)
GFR calc Af Amer: 100 mL/min/{1.73_m2} (ref >59)
GFR calc non Af Amer: 87 mL/min/{1.73_m2} (ref >59)
GLOBULIN, TOTAL: 2.3 g/dL (ref 1.5–4.5)
GLUCOSE: 101 mg/dL — AB (ref 65–99)
Potassium: 4.4 mmol/L (ref 3.5–5.2)
Sodium: 138 mmol/L (ref 134–144)
Total Protein: 6.2 g/dL (ref 6.0–8.5)

## 2018-01-02 LAB — SEDIMENTATION RATE: SED RATE: 16 mm/h (ref 0–32)

## 2018-01-02 LAB — LIPID PANEL
CHOLESTEROL TOTAL: 199 mg/dL (ref 100–199)
Chol/HDL Ratio: 5.7 ratio — ABNORMAL HIGH (ref 0.0–4.4)
HDL: 35 mg/dL — AB (ref >39)
LDL CALC: 130 mg/dL — AB (ref 0–99)
Triglycerides: 168 mg/dL — ABNORMAL HIGH (ref 0–149)
VLDL CHOLESTEROL CAL: 34 mg/dL (ref 5–40)

## 2018-01-02 LAB — TSH: TSH: 2.06 u[IU]/mL (ref 0.450–4.500)

## 2018-01-02 LAB — HIV ANTIBODY (ROUTINE TESTING W REFLEX): HIV Screen 4th Generation wRfx: NONREACTIVE

## 2018-01-02 LAB — HEPATITIS C ANTIBODY

## 2018-01-02 LAB — C-REACTIVE PROTEIN: CRP: 3.5 mg/L (ref 0.0–4.9)

## 2018-01-03 MED ORDER — PRAVASTATIN SODIUM 20 MG PO TABS: 20.0000 mg | ORAL_TABLET | Freq: Every evening | ORAL | 11 refills | Status: DC

## 2018-01-03 NOTE — Addendum Note (Signed)
Addended by: Arnetha Courser on: 01/03/2018 12:22 PM   Modules accepted: Orders

## 2018-01-04 NOTE — Progress Notes (Signed)
Internal Medicine Clinic Attending  Case discussed with Dr. Amin at the time of the visit.  We reviewed the resident's history and exam and pertinent patient test results.  I agree with the assessment, diagnosis, and plan of care documented in the resident's note.    

## 2018-01-17 ENCOUNTER — Encounter: Payer: Self-pay | Admitting: *Deleted

## 2018-03-30 ENCOUNTER — Ambulatory Visit (HOSPITAL_COMMUNITY): Payer: Self-pay | Admitting: Psychiatry

## 2018-04-09 ENCOUNTER — Encounter: Payer: Self-pay | Admitting: Internal Medicine

## 2018-04-12 ENCOUNTER — Other Ambulatory Visit (HOSPITAL_COMMUNITY)
Admission: RE | Admit: 2018-04-12 | Discharge: 2018-04-12 | Disposition: A | Discharge status: Home / Self Care | Payer: Self-pay | Source: Ambulatory Visit | Attending: Internal Medicine | Admitting: Internal Medicine

## 2018-04-12 ENCOUNTER — Other Ambulatory Visit: Payer: Self-pay

## 2018-04-12 ENCOUNTER — Ambulatory Visit (INDEPENDENT_AMBULATORY_CARE_PROVIDER_SITE_OTHER): Payer: Self-pay | Admitting: Internal Medicine

## 2018-04-12 ENCOUNTER — Encounter: Payer: Self-pay | Admitting: Internal Medicine

## 2018-04-12 VITALS — BP 113/73 | HR 74 | Temp 98.1°F | Ht 65.0 in | Wt 210.0 lb

## 2018-04-12 DIAGNOSIS — F418 Other specified anxiety disorders: Secondary | ICD-10-CM

## 2018-04-12 DIAGNOSIS — Z23 Encounter for immunization: Secondary | ICD-10-CM | POA: Insufficient documentation

## 2018-04-12 DIAGNOSIS — F319 Bipolar disorder, unspecified: Secondary | ICD-10-CM | POA: Insufficient documentation

## 2018-04-12 DIAGNOSIS — A5901 Trichomonal vulvovaginitis: Secondary | ICD-10-CM

## 2018-04-12 DIAGNOSIS — M797 Fibromyalgia: Secondary | ICD-10-CM | POA: Insufficient documentation

## 2018-04-12 DIAGNOSIS — Z124 Encounter for screening for malignant neoplasm of cervix: Secondary | ICD-10-CM

## 2018-04-12 DIAGNOSIS — I69898 Other sequelae of other cerebrovascular disease: Secondary | ICD-10-CM | POA: Insufficient documentation

## 2018-04-12 DIAGNOSIS — B373 Candidiasis of vulva and vagina: Secondary | ICD-10-CM

## 2018-04-12 HISTORY — DX: Encounter for immunization: Z23

## 2018-04-12 MED ORDER — AMITRIPTYLINE HCL 25 MG PO TABS: 25.0000 mg | ORAL_TABLET | Freq: Every day | ORAL | 1 refills | Status: DC

## 2018-04-12 MED ORDER — DULOXETINE HCL 30 MG PO CPEP: 30.0000 mg | ORAL_CAPSULE | Freq: Two times a day (BID) | ORAL | 2 refills | Status: DC

## 2018-04-12 NOTE — Assessment & Plan Note (Addendum)
Pap smear was done today.  No vaginal discharge or complaints. Continued to have irregular cycle. Doing some hot flashes.  Most likely pre- Menopausal. Encouraged patient again to see her gynecologist.  Addendum.  Pap smear was negative for any malignancies, shows Trichomonas vaginalis and some Candida. Called the patient and gave her a prescription of Flagyl 2 g for 1 dose.  Patient is asymptomatic but there is recommendations of treating asymptomatic patients positive for Trichomonas vaginalis.

## 2018-04-12 NOTE — Patient Instructions (Addendum)
Thank you for visiting clinic today. As we discussed I am starting you on a new medicine called Cymbalta at a lower dose 30 mg daily, you will take 1 tablet daily for 1 week, then increase to 1 tablet twice a day. Please follow-up with your psychiatrist and discussed about Cymbalta with him. If you continue to take Cymbalta and it help with your symptoms of fibromyalgia, we can increase the dose during next follow-up visit. We are also doing Pap smear today-will call you for any abnormal results. Follow-up in 3 months.

## 2018-04-12 NOTE — Assessment & Plan Note (Signed)
Patient continued to experience intermittent aches and pains. Amitriptyline to help with sleep.  According to patient there are/nights when she was unable to sleep but attribute those sleepless nights because of her current circumstances.  She continued to take her boyfriend's gabapentin without any relief.  She was told last time to stop taking it. I told her again to stop taking gabapentin if it is not causing any good.  I will start her on Cymbalta 30 mg twice daily, she was instructed to take 30 mg daily for first week then increase it to twice daily. She was also encouraged to talk with her psychiatrist.

## 2018-04-12 NOTE — Assessment & Plan Note (Signed)
Flu shot was provided. 

## 2018-04-12 NOTE — Assessment & Plan Note (Signed)
Her PHQ 9 score was 15 today. She was unable to see psychiatrist because of some change in Dr. schedule.  She is scheduled to see him on April 21, 2018 now.  Encouraged patient to keep up with her appointment. Cymbalta should help with her depression and fibromyalgia. She needs psychiatrist to help to see if she need any mood stabilizer. Currently denies any signs of mania.

## 2018-04-12 NOTE — Progress Notes (Signed)
   CC: For follow-up of her fibromyalgia and depression.  HPI:  Andrea [name] is a 47 y.o. with past medical history as listed below came to the clinic for follow-up of her fibromyalgia and depression.  Please see assessment and plan for her chronic conditions.  Past Medical History:  Diagnosis Date  . Anticardiolipin antibody syndrome (HCC)   . Bipolar affective (HCC)   . Drug-seeking behavior   . Fibromyalgia   . Stroke [facility])    L sided weakness   Review of Systems: Negative except mentioned in HPI.  Physical Exam:  Vitals:   04/12/18 1344  BP: 113/73  Pulse: 74  Temp: 98.1 F (36.7 C)  TempSrc: Oral  SpO2: 98%  Weight: 210 lb (95.3 kg)  Height: 5\' 5"  (1.651 m)   General: Vital signs reviewed.  Patient is well-developed and well-nourished, in no acute distress and cooperative with exam.  Head: Normocephalic and atraumatic. Eyes: EOMI, conjunctivae normal, no scleral icterus.  Cardiovascular: RRR, S1 normal, S2 normal, no murmurs, gallops, or rubs. Pulmonary/Chest: Clear to auscultation bilaterally, no wheezes, rales, or rhonchi. Abdominal: Soft, non-tender, non-distended, BS +, no masses, organomegaly, or guarding present.  Musculoskeletal: No joint deformities, erythema, or stiffness, ROM full and nontender.  Multiple tender spots on her back. Extremities: No lower extremity edema bilaterally,  pulses symmetric and intact bilaterally. No cyanosis or clubbing.  Skin: Warm, dry and intact. No rashes or erythema. Psychiatric: Normal mood and affect. speech and behavior is normal. Cognition and memory are normal.  Assessment & Plan:   See Encounters Tab for problem based charting.  Patient discussed with Dr. .

## 2018-04-13 LAB — CYTOLOGY - PAP: Diagnosis: NEGATIVE

## 2018-04-13 NOTE — Progress Notes (Signed)
Internal Medicine Clinic Attending  Case discussed with Dr. Amin at the time of the visit.  We reviewed the resident's history and exam and pertinent patient test results.  I agree with the assessment, diagnosis, and plan of care documented in the resident's note.    

## 2018-04-14 MED ORDER — AZITHROMYCIN 1 G PO PACK: 1.0000 g | PACK | Freq: Once | ORAL | 0 refills | Status: DC

## 2018-04-14 MED ORDER — METRONIDAZOLE 500 MG PO TABS: 2000.0000 mg | ORAL_TABLET | Freq: Once | ORAL | 0 refills | Status: AC

## 2018-04-14 NOTE — Addendum Note (Signed)
Addended by: Arnetha Courser on: 04/14/2018 01:40 PM   Modules accepted: Orders

## 2018-04-21 ENCOUNTER — Ambulatory Visit (INDEPENDENT_AMBULATORY_CARE_PROVIDER_SITE_OTHER): Payer: Self-pay | Admitting: Psychiatry

## 2018-04-21 ENCOUNTER — Encounter (HOSPITAL_COMMUNITY): Payer: Self-pay | Admitting: Psychiatry

## 2018-04-21 DIAGNOSIS — F17200 Nicotine dependence, unspecified, uncomplicated: Secondary | ICD-10-CM

## 2018-04-21 DIAGNOSIS — M797 Fibromyalgia: Secondary | ICD-10-CM

## 2018-04-21 DIAGNOSIS — F418 Other specified anxiety disorders: Secondary | ICD-10-CM

## 2018-04-21 DIAGNOSIS — R0683 Snoring: Secondary | ICD-10-CM

## 2018-04-21 MED ORDER — MIRTAZAPINE 15 MG PO TABS: ORAL_TABLET | ORAL | 0 refills | Status: DC

## 2018-04-21 MED ORDER — AMITRIPTYLINE HCL 50 MG PO TABS: ORAL_TABLET | ORAL | 0 refills | Status: DC

## 2018-04-21 NOTE — Addendum Note (Signed)
Addended by: Angus Palms A on: 04/21/2018 09:40 AM   Modules accepted: Orders
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
## 2018-04-21 NOTE — Patient Instructions (Signed)
STOP cymbalta  Increase elavil to 50 mg for 30 days then 75 mg after that  START remeron 15 mg for 30 days (1/2 tablet) then go up to the 30 mg full tablet

## 2018-05-21 ENCOUNTER — Ambulatory Visit (HOSPITAL_BASED_OUTPATIENT_CLINIC_OR_DEPARTMENT_OTHER): Payer: Self-pay | Attending: Psychiatry | Admitting: Internal Medicine

## 2018-05-21 DIAGNOSIS — R0683 Snoring: Secondary | ICD-10-CM

## 2018-05-21 DIAGNOSIS — G4733 Obstructive sleep apnea (adult) (pediatric): Secondary | ICD-10-CM | POA: Insufficient documentation

## 2018-05-21 DIAGNOSIS — R0902 Hypoxemia: Secondary | ICD-10-CM | POA: Insufficient documentation

## 2018-05-21 DIAGNOSIS — F418 Other specified anxiety disorders: Secondary | ICD-10-CM

## 2018-05-21 DIAGNOSIS — M797 Fibromyalgia: Secondary | ICD-10-CM

## 2018-05-25 ENCOUNTER — Telehealth (HOSPITAL_COMMUNITY): Payer: Self-pay

## 2018-05-25 ENCOUNTER — Other Ambulatory Visit (HOSPITAL_COMMUNITY): Payer: Self-pay | Admitting: Psychiatry

## 2018-05-25 DIAGNOSIS — F418 Other specified anxiety disorders: Secondary | ICD-10-CM

## 2018-05-25 DIAGNOSIS — M797 Fibromyalgia: Secondary | ICD-10-CM

## 2018-05-25 NOTE — Telephone Encounter (Signed)
This was a Dr. Rene Kocher patient. This patient has a Saturday New patient appointment with Dr. Alta Corning on 06-05-18, but the patient needs a refill on Remeron 15mg . Please advise. Pharmacy is 

## 2018-05-25 NOTE — Telephone Encounter (Signed)
Need to clarify what dose of Remeron she is taking.

## 2018-05-26 NOTE — Telephone Encounter (Signed)
This patient is on 15mg  of Remeron

## 2018-05-27 ENCOUNTER — Other Ambulatory Visit (HOSPITAL_COMMUNITY): Payer: Self-pay | Admitting: Psychiatry

## 2018-05-27 DIAGNOSIS — M797 Fibromyalgia: Secondary | ICD-10-CM

## 2018-05-27 DIAGNOSIS — F418 Other specified anxiety disorders: Secondary | ICD-10-CM

## 2018-05-27 NOTE — Telephone Encounter (Signed)
If she is taking Remeron 15 mg then she should be good until November 28.  She was given 90 tablets by Dr. Suann Larry.

## 2018-05-29 DIAGNOSIS — G4733 Obstructive sleep apnea (adult) (pediatric): Secondary | ICD-10-CM

## 2018-05-29 NOTE — Procedures (Signed)
    Patient Name: Andrea [name], Andrea [name] Date: 05/23/2018 Gender: Female D.O.B: 04/17/1971 Age (years): 47 Referring Provider: Angus Palms Height (inches): 64 Interpreting Physician: Jetty Duhamel MD, ABSM Weight (lbs): 210 RPSGT:  Sink BMI: 36 MRN: [id number] Neck Size: 15.50  CLINICAL INFORMATION Sleep Study Type: HST Indication for sleep study: Snoring  Epworth Sleepiness Score: 3  SLEEP STUDY TECHNIQUE A multi-channel overnight portable sleep study was performed. The channels recorded were: nasal airflow, thoracic respiratory movement, and oxygen saturation with a pulse oximetry. Snoring was also monitored.  MEDICATIONS Patient self administered medications include: none reported.  SLEEP ARCHITECTURE Patient was studied for 462 minutes. The sleep efficiency was 99.6 % and the patient was supine for 70%. The arousal index was 0.0 per hour.  RESPIRATORY PARAMETERS The overall AHI was 13.5 per hour, with a central apnea index of 0.0 per hour. The oxygen nadir was 78% during sleep.  CARDIAC DATA Mean heart rate during sleep was 62.8 bpm.  IMPRESSIONS - Mild obstructive sleep apnea occurred during this study (AHI = 13.5/h). - No significant central sleep apnea occurred during this study (CAI = 0.0/h). - Oxygen desaturation was noted during this study (Min O2 = 78%, Mean 90%). - Patient snored.  DIAGNOSIS - Obstructive Sleep Apnea (327.23 [G47.33 ICD-10]) - Nocturnal Hypoxemia (327.26 [G47.36 ICD-10])  RECOMMENDATIONS - Suggest CPAP titration sleep study or DME autopap. Other options, including a fitted oral appliance, ENT or Sleep Medicine evaluation, would be based on clinical judgment. - Be careful with alcohol, sedatives and other CNS depressants that may worsen sleep apnea and disrupt normal sleep architecture. - Sleep hygiene should be reviewed to assess factors that may improve sleep quality. - Weight management and regular exercise should be  initiated or continued.  [Electronically signed] 05/29/2018 11:37 AM  Jetty Duhamel MD, ABSM Diplomate, American Board of Sleep Medicine   NPI: [id number]                         Jetty Duhamel Diplomate, American Board of Sleep Medicine  ELECTRONICALLY SIGNED ON:  05/29/2018, 11:35 AM [facility] PH: [phone]   FX: [phone] ACCREDITED BY THE AMERICAN ACADEMY OF SLEEP MEDICINE

## 2018-05-31 ENCOUNTER — Other Ambulatory Visit: Payer: Self-pay | Admitting: Internal Medicine

## 2018-05-31 DIAGNOSIS — G4733 Obstructive sleep apnea (adult) (pediatric): Secondary | ICD-10-CM

## 2018-06-01 ENCOUNTER — Ambulatory Visit (HOSPITAL_COMMUNITY): Payer: Self-pay | Admitting: Psychology

## 2018-06-01 ENCOUNTER — Encounter (HOSPITAL_COMMUNITY): Payer: Self-pay | Admitting: Psychology

## 2018-06-01 ENCOUNTER — Encounter

## 2018-06-01 NOTE — Progress Notes (Signed)
Andrea [name] is a 47 y.o. female patient who didn't show for appointment.  Letter sent.        Forde Radon, LPC

## 2018-06-05 ENCOUNTER — Ambulatory Visit (HOSPITAL_COMMUNITY): Payer: Self-pay | Admitting: Psychiatry

## 2018-06-24 ENCOUNTER — Ambulatory Visit (HOSPITAL_COMMUNITY): Payer: Self-pay | Admitting: Psychology

## 2018-07-01 ENCOUNTER — Ambulatory Visit (INDEPENDENT_AMBULATORY_CARE_PROVIDER_SITE_OTHER): Payer: No Typology Code available for payment source | Admitting: Psychology

## 2018-07-01 ENCOUNTER — Encounter (HOSPITAL_COMMUNITY): Payer: Self-pay | Admitting: Psychology

## 2018-07-01 DIAGNOSIS — F418 Other specified anxiety disorders: Secondary | ICD-10-CM

## 2018-07-01 DIAGNOSIS — M797 Fibromyalgia: Secondary | ICD-10-CM
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
## 2018-07-07 ENCOUNTER — Ambulatory Visit: Payer: Self-pay

## 2018-07-12 ENCOUNTER — Ambulatory Visit (INDEPENDENT_AMBULATORY_CARE_PROVIDER_SITE_OTHER): Payer: Self-pay | Admitting: Internal Medicine

## 2018-07-12 ENCOUNTER — Encounter: Payer: Self-pay | Admitting: Internal Medicine

## 2018-07-12 ENCOUNTER — Ambulatory Visit: Payer: Self-pay

## 2018-07-12 ENCOUNTER — Other Ambulatory Visit: Payer: Self-pay

## 2018-07-12 VITALS — BP 129/83 | HR 70 | Temp 97.8°F | Ht 65.0 in | Wt 214.1 lb

## 2018-07-12 DIAGNOSIS — N921 Excessive and frequent menstruation with irregular cycle: Secondary | ICD-10-CM

## 2018-07-12 DIAGNOSIS — Z0000 Encounter for general adult medical examination without abnormal findings: Secondary | ICD-10-CM | POA: Insufficient documentation

## 2018-07-12 DIAGNOSIS — F418 Other specified anxiety disorders: Secondary | ICD-10-CM

## 2018-07-12 DIAGNOSIS — Z79899 Other long term (current) drug therapy: Secondary | ICD-10-CM

## 2018-07-12 DIAGNOSIS — Z1239 Encounter for other screening for malignant neoplasm of breast: Secondary | ICD-10-CM

## 2018-07-12 DIAGNOSIS — M797 Fibromyalgia: Secondary | ICD-10-CM

## 2018-07-12 MED ORDER — PREGABALIN 75 MG PO CAPS: 75.0000 mg | ORAL_CAPSULE | Freq: Three times a day (TID) | ORAL | 2 refills | Status: DC

## 2018-07-12 MED ORDER — DULOXETINE HCL 30 MG PO CPEP: 30.0000 mg | ORAL_CAPSULE | Freq: Two times a day (BID) | ORAL | 3 refills | Status: DC

## 2018-07-12 NOTE — Assessment & Plan Note (Signed)
She continued to experience pain. Grief counselor for her depression. It was very tearful today and was feeling worsening of her depressive symptoms and pain since she stopped taking her Cymbalta. Patient ran out of her Cymbalta this Friday and could not afford to get the refill. She continued to take her boyfriend's gabapentin 600 mg 3 times a day with some minimum relief of her pain.  I advised again today not to use her boyfriend's gabapentin and offered her on prescription. We also discussed switching to Lyrica if she can get enrolled at patient assistant program. She was interested in trying Lyrica.  A new prescription for Cymbalta 30 mg twice daily was sent to [facility] outpatient pharmacy with IM program. Give her a prescription of Lyrica-with instruction to start at 75 mg daily and gradually increase to 75 mg 3 times a day at 1 week interval. We gave her a referral to see Dr. Selena Batten can help her get her medications. She will continue with amitriptyline.

## 2018-07-12 NOTE — Assessment & Plan Note (Signed)
Appears more depressed and tearful. Her PHQ 9 score was 16 today.  She started following up with a psychiatrist and now going through a therapy, which includes grief counseling. She was very tearful right remembering her deceased son who died in 01-02-2015 after having a head-on occlusion while he was driving under the influence. She was also experiencing withdrawal after suddenly stopping her Cymbalta as she ran out of that medicine since Friday and could not afford to get a refill.  We discussed about the importance of continue taking Cymbalta and do not suddenly stopped it. Provided her a new prescription with IM program so she can get it $4. I encouraged her to continue see her psychiatrist and counselor as I do not want to give her any antidepressants because of her history of bipolar disorder, she is not on any mood stabilizer at this time. She used to take mirtazapine and her psychiatrist discontinued that because it was worsening her manic symptoms.

## 2018-07-12 NOTE — Assessment & Plan Note (Signed)
We ordered mammogram today.

## 2018-07-12 NOTE — Progress Notes (Signed)
   CC: For follow-up of her fibromyalgia and depression.  HPI:  [name] is a 47 y.o. with past medical history as listed below came to the clinic for follow-up of her fibromyalgia and depression.  Please see assessment and plan for her chronic conditions.  Past Medical History:  Diagnosis Date  . Anticardiolipin antibody syndrome (HCC)   . Bipolar affective (HCC)   . Drug-seeking behavior   . Fibromyalgia   . Stroke [facility])    L sided weakness   Review of Systems:  General: Denies fever, chills, fatigue, unexpected weight loss, change in appetite and diaphoresis.  Respiratory: Denies SOB, occasional dry cough, denies DOE, chest tightness, and wheezing.   Cardiovascular: Denies chest pain and palpitations.  Gastrointestinal: Denies nausea, vomiting, abdominal pain, diarrhea, constipation, blood in stool and abdominal distention.  Genitourinary: Vaginal bleeding for the past 1 month with clots.  Denies any pain. Endocrine: Denies hot or cold intolerance, polyuria, and polydipsia. Musculoskeletal: Generalized myalgia. Skin: Denies pallor, rash and wounds.  Neurological occasional dizziness, headaches, weakness, lightheadedness, denies numbness, seizures, and syncope. Psychiatric/Behavioral: Feeling more depressed and tearful since she stopped taking her Cymbalta.  Physical Exam:  Vitals:   07/12/18 1326  BP: 129/83  Pulse: 70  Temp: 97.8 F (36.6 C)  TempSrc: Oral  SpO2: 99%  Weight: 214 lb 1.6 oz (97.1 kg)  Height: 5\' 5"  (1.651 m)   General: Vital signs reviewed.  Patient is well-developed and well-nourished, in no acute distress and cooperative with exam.  Head: Normocephalic and atraumatic. Eyes: EOMI, conjunctivae normal, no scleral icterus.  Neck: Supple, trachea midline, normal ROM, no JVD, masses, thyromegaly, or carotid bruit present.  Cardiovascular: RRR, S1 normal, S2 normal, no murmurs, gallops, or rubs. Pulmonary/Chest: Clear to auscultation  bilaterally, no wheezes, rales, or rhonchi. Abdominal: Soft, non-tender, non-distended, BS +,  Extremities: No lower extremity edema bilaterally,  pulses symmetric and intact bilaterally. No cyanosis or clubbing. Neurological: A&O x3, Strength is normal and symmetric bilaterally, cranial nerve II-XII are grossly intact, no focal motor deficit, sensory intact to light touch bilaterally.  Skin: Warm, dry and intact. No rashes or erythema. Psychiatric: Appears little down and tearful.  Assessment & Plan:   See Encounters Tab for problem based charting.  Patient discussed with Dr. 

## 2018-07-12 NOTE — Patient Instructions (Signed)
Thank you for visiting clinic today. As we discussed I am giving you a prescription for Cymbalta to be filled at [facility] outpatient pharmacy where you can get it for $4, Please make an appointment to see our pharmacist Dr. Selena Batten, she will help you get a patient assistant program for Lyrica.  Once you start getting your medicine you will started with 75 mg daily and gradually increase to 75 mg 3 times a day each week. Continue taking your amitriptyline. I am also giving you a referral to see a gynecologist for your vaginal bleeding to discuss your options. I am also giving you a referral for mammogram. Follow-up in 67-month.

## 2018-07-12 NOTE — Assessment & Plan Note (Addendum)
She was complaining of heavy vaginal bleeding for the past month. According to patient she is passing clots and using all boxes of 36 tampons in 2 days.  She denies any abdominal pain.  She does feel little dizzy and weak. She did not had any vaginal bleeding for the past 9-month and then started heavy bleeding for the past 1 month which is more consistent with premenopausal, estrogen predominant cycles causing breakthrough bleeding.  We discussed the options of starting her on oral contraception, patient does not want to use any hormonal therapy because of its associated risk of stroke.  Referral was provided to see a gynecologist to discuss her options which can include a drug eluded IUD, endometrial ablation or hysterectomy it depends on the discussion between gynecologist and patient. -We will check her CBC and ferritin level today.

## 2018-07-13 ENCOUNTER — Ambulatory Visit: Payer: Self-pay | Admitting: Pharmacist

## 2018-07-13 LAB — CBC
HEMATOCRIT: 42.4 % (ref 34.0–46.6)
HEMOGLOBIN: 14.9 g/dL (ref 11.1–15.9)
MCH: 31.2 pg (ref 26.6–33.0)
MCHC: 35.1 g/dL (ref 31.5–35.7)
MCV: 89 fL (ref 79–97)
Platelets: 284 10*3/uL (ref 150–450)
RBC: 4.78 x10E6/uL (ref 3.77–5.28)
RDW: 13.9 % (ref 12.3–15.4)
WBC: 10.2 10*3/uL (ref 3.4–10.8)

## 2018-07-13 LAB — IRON: Iron: 154 ug/dL (ref 27–159)

## 2018-07-13 LAB — FERRITIN: FERRITIN: 32 ng/mL (ref 15–150)

## 2018-07-14 NOTE — Progress Notes (Signed)
Internal Medicine Clinic Attending  Case discussed with Dr. Amin at the time of the visit.  We reviewed the resident's history and exam and pertinent patient test results.  I agree with the assessment, diagnosis, and plan of care documented in the resident's note.    

## 2018-07-19 ENCOUNTER — Ambulatory Visit: Payer: Self-pay

## 2018-07-19 ENCOUNTER — Ambulatory Visit: Payer: Self-pay | Admitting: Pharmacist

## 2018-07-19 DIAGNOSIS — Z8673 Personal history of transient ischemic attack (TIA), and cerebral infarction without residual deficits: Secondary | ICD-10-CM

## 2018-07-19 MED ORDER — PRAVASTATIN SODIUM 20 MG PO TABS: 20.0000 mg | ORAL_TABLET | Freq: Every evening | ORAL | 11 refills | Status: DC

## 2018-07-19 MED FILL — PRAVASTATIN SODIUM 20 MG TA: 20 | 30 days supply | Qty: 30 | Fill #0

## 2018-07-19 NOTE — Progress Notes (Signed)
S: Andrea [name] is a 47 y.o. female reports to clinical pharmacist appointment for medication access help.   Allergies  Allergen Reactions  . Sulfa Antibiotics    Prior to Admission medications   Medication Sig Start Date End Date Taking? Authorizing Provider  amitriptyline (ELAVIL) 50 MG tablet Take 1 tablet (50 mg total) by mouth at bedtime for 30 days, THEN 1.5 tablets (75 mg total) at bedtime. 04/21/18 06/20/18  Burnard Leigh, MD  aspirin EC 81 MG tablet Take 1 tablet (81 mg total) by mouth daily. 01/01/18 01/01/19  Arnetha Courser, MD  DULoxetine (CYMBALTA) 30 MG capsule Take 1 capsule (30 mg total) by mouth 2 (two) times daily. 07/12/18 07/12/19  Arnetha Courser, MD  pravastatin (PRAVACHOL) 20 MG tablet Take 1 tablet (20 mg total) by mouth every evening. Patient not taking: Reported on 07/01/2018 01/03/18 01/03/19  Arnetha Courser, MD  pregabalin (LYRICA) 75 MG capsule Take 1 capsule (75 mg total) by mouth 3 (three) times daily. 07/12/18 07/12/19  Arnetha Courser, MD   Past Medical History:  Diagnosis Date  . Anticardiolipin antibody syndrome (HCC)   . Bipolar affective (HCC)   . Drug-seeking behavior   . Fibromyalgia   . Stroke [facility])    L sided weakness   Social History   Socioeconomic History  . Marital status: Media planner    Spouse name: Fayrene Fearing  . Number of children: 2  . Years of education: Not on file  . Highest education level: Not on file  Occupational History  . Not on file  Social Needs  . Financial resource strain: Not on file  . Food insecurity:    Worry: Not on file    Inability: Not on file  . Transportation needs:    Medical: Not on file    Non-medical: Not on file  Tobacco Use  . Smoking status: Current Every Day Smoker    Packs/day: 1.00    Types: Cigarettes  . Smokeless tobacco: Never Used  . Tobacco comment: .5-1 PPD  Substance and Sexual Activity  . Alcohol use: Yes    Comment: rarely.  . Drug use: No  . Sexual activity: Not on file   Lifestyle  . Physical activity:    Days per week: Not on file    Minutes per session: Not on file  . Stress: Not on file  Relationships  . Social connections:    Talks on phone: Not on file    Gets together: Not on file    Attends religious service: Not on file    Active member of club or organization: Not on file    Attends meetings of clubs or organizations: Not on file    Relationship status: Not on file  Other Topics Concern  . Not on file  Social History Narrative  . Not on file   Family History  Adopted: Yes  Problem Relation Age of Onset  . Bipolar disorder Mother   . Fibromyalgia Mother   . Bipolar disorder Maternal Grandmother   . Fibromyalgia Maternal Grandmother   . Arthritis Maternal Grandmother     O:    Component Value Date/Time   CHOL 199 01/01/2018 1556   HDL 35 (L) 01/01/2018 1556   TRIG 168 (H) 01/01/2018 1556   AST 14 01/01/2018 1556   ALT 11 01/01/2018 1556   NA 138 01/01/2018 1556   K 4.4 01/01/2018 1556   CL 106 01/01/2018 1556   CO2 20 01/01/2018 1556   GLUCOSE 101 (  H) 01/01/2018 1556   GLUCOSE 84 12/12/2009 2025   BUN 17 01/01/2018 1556   CREATININE 0.81 01/01/2018 1556   CALCIUM 9.6 01/01/2018 1556   GFRNONAA 87 01/01/2018 1556   GFRAA 100 01/01/2018 1556   WBC 10.2 07/12/2018 1437   WBC 9.0 12/12/2009 2025   HGB 14.9 07/12/2018 1437   HCT 42.4 07/12/2018 1437   PLT 284 07/12/2018 1437   TSH 2.060 01/01/2018 1556   Ht Readings from Last 2 Encounters:  07/12/18 5\' 5"  (1.651 m)  04/12/18 5\' 5"  (1.651 m)   Wt Readings from Last 2 Encounters:  07/12/18 214 lb 1.6 oz (97.1 kg)  04/12/18 210 lb (95.3 kg)   There is no height or weight on file to calculate BMI. BP Readings from Last 3 Encounters:  07/12/18 129/83  04/12/18 113/73  01/01/18 134/80     A/P: Patient is uninsured and unable to afford her medications. Today we worked on paperwork to get her enrolled in a patient assistance program for Lyrica access and enrolled in  [facility] for the rest of her medications.   In the meantime, she will be able to pick up Cymbalta and pravastatin at the [facility]; $4 petty cash was given to allow her to pick up her pravastatin.    45 minutes spent face-to-face with the patient during the encounter. 100% of the time was spent on medication access.

## 2018-08-24 ENCOUNTER — Other Ambulatory Visit: Payer: Self-pay | Admitting: Pharmacist

## 2018-08-24 DIAGNOSIS — Z8673 Personal history of transient ischemic attack (TIA), and cerebral infarction without residual deficits: Secondary | ICD-10-CM

## 2018-08-24 DIAGNOSIS — F418 Other specified anxiety disorders: Secondary | ICD-10-CM

## 2018-08-24 MED ORDER — ATORVASTATIN CALCIUM 40 MG PO TABS: 40.0000 mg | ORAL_TABLET | Freq: Every day | ORAL | 3 refills | Status: DC

## 2018-08-24 MED ORDER — DULOXETINE HCL 30 MG PO CPEP: 30.0000 mg | ORAL_CAPSULE | Freq: Two times a day (BID) | ORAL | 0 refills | Status: DC

## 2018-08-24 MED ORDER — ASPIRIN EC 81 MG PO TBEC: 81.0000 mg | DELAYED_RELEASE_TABLET | Freq: Every day | ORAL | 3 refills | Status: AC

## 2018-08-24 NOTE — Progress Notes (Signed)
Patient was approved for free medications through [facility] pharmacy. Prescriptions sent except for amitriptylline (outdated), and pregabalin (receives from manufacturer). Pravastatin was switched to atorvastatin due to formulary.

## 2018-08-26 NOTE — Progress Notes (Signed)
Thank you :)

## 2018-08-26 NOTE — Addendum Note (Signed)
Addended by: Neomia Dear on: 08/26/2018 04:42 PM   Modules accepted: Orders

## 2018-08-30 ENCOUNTER — Ambulatory Visit (INDEPENDENT_AMBULATORY_CARE_PROVIDER_SITE_OTHER): Payer: No Typology Code available for payment source | Admitting: Psychology

## 2018-08-30 DIAGNOSIS — F418 Other specified anxiety disorders: Secondary | ICD-10-CM

## 2018-08-30 NOTE — Progress Notes (Signed)
   THERAPIST PROGRESS NOTE  Session Time: 12.30pm-1.16pm  Participation Level: Active  Behavioral Response: Well GroomedAlertblunted  Type of Therapy: Individual Therapy  Treatment Goals addressed: Diagnosis: MDd and goal 1.  Interventions: CBT and Supportive  Summary: Andrea [name] is a 48 y.o. female who presents with affect blunted.  Pt reported that her holidays were good- visited her parents.  Pt reported that she has been dealing on and off w/ pain.  Pt at times began getting tearful as discussed her deceased son and then avoided stated had to change subject didn't want to go there.  Pt discussed having a wall up and not getting close to people to not get hurt.  Pt discussed growing up w/ mom and strict expectations and certain presentation to others.  Pt discussed how she is making progress w/ not holding these same views and feeling ok about- no longer dying her hair as example.  .   Suicidal/Homicidal: Nowithout intent/plan  Therapist Response: Assessed pt current functioning per pt report. Processed w/pt her interactions w/ family.  Explored w/pt values grew up w/ and messages instilled and ways of challenging for self unhealthy messages.  Reflected and normalized emotion re: son and accepting expression of.  Plan: Return again in 1 weeks.  Diagnosis: MDD   Forde Radon, [facility] 08/30/2018

## 2018-09-06 ENCOUNTER — Ambulatory Visit (INDEPENDENT_AMBULATORY_CARE_PROVIDER_SITE_OTHER): Payer: No Typology Code available for payment source | Admitting: Psychology

## 2018-09-06 DIAGNOSIS — F418 Other specified anxiety disorders: Secondary | ICD-10-CM

## 2018-09-06 NOTE — Progress Notes (Signed)
   THERAPIST PROGRESS NOTE  Session Time: 12.33pm-1.20pm  Participation Level: Active  Behavioral Response: Well GroomedAlertdepressed  Type of Therapy: Individual Therapy  Treatment Goals addressed: Diagnosis: MDD and goal 1.  Interventions: CBT and Supportive  Summary: Andrea [name] is a 48 y.o. female who presents with affect congruent w/ report of mentally tired.  Pt reported that she has continued to experience a lot of pain and she is tired of experiencing pain.  Pt reported that her doctor is working w/ drug company to try to get Lyrica prescription free for her. Pt reported that she goes to renew her orange card this week as well and will be following up w/ several referrals for medical care.  Pt reported that she did go to church yesterday and that was positive.  Pt recognizes that a lot of time she doesn't go and get out to avoid the increased pain.    Suicidal/Homicidal: Nowithout intent/plan  Therapist Response: Assessed pt current functioning per pt report. Processed w/pt coping w/ pain and impact having on mood. Explored w/pt the balance between preventing pain and staying engaged.    Plan: Return again in 1 weeks.  Diagnosis: MDD   Forde Radon, [facility] 09/06/2018

## 2018-09-08 ENCOUNTER — Ambulatory Visit: Payer: Self-pay

## 2018-09-13 ENCOUNTER — Ambulatory Visit (INDEPENDENT_AMBULATORY_CARE_PROVIDER_SITE_OTHER): Payer: No Typology Code available for payment source | Admitting: Psychology

## 2018-09-13 DIAGNOSIS — F418 Other specified anxiety disorders: Secondary | ICD-10-CM

## 2018-09-13 NOTE — Progress Notes (Signed)
   THERAPIST PROGRESS NOTE  Session Time: 2.30pm-3.14pm  Participation Level: Active  Behavioral Response: Well GroomedAlertaffect wnl  Type of Therapy: Individual Therapy  Treatment Goals addressed: Diagnosis: Mdd and goal 1.  Interventions: CBT and Supportive  Summary: Andrea [name] is a 48 y.o. female who presents with affect wnl.  Pt reported that she has been feeling more withdrawn past couple of days- not wanting to talk.  Pt report nothing particular on her mind- pt is able to acknowledge as depression.  Pt reported new pain today in elbow.  Pt is still waiting for word from dr. If financial assistance for Lyrica approved.  Pt reported that she dis have a message from son's bestfriend's grandmother that concerned her.  Pt is able to identify that she tends to think worst case scenario and usually not the case.  Pt is able to reframe some w/ this.  Pt is able to report she feels comfortable in session- rapport and that will comfortable talking about things as needs.  Pt reports would like to go to every other week frequency and knows that may step up in March again as anniversary of son's death.  Suicidal/Homicidal: Nowithout intent/plan  Therapist Response: Assessed pt current functioning per pt report. Processed w/pt coping w/ anxiety- worries, ruminating and worst case scenario thoughts.  Discussed ways of challenging and refaming and acknowledging as anxiety.  Discussed continue counseling and frequency.  Plan: Return again in 1 weeks, then transition to every other week..  Diagnosis: MDD   YATES,LEANNE, [facility] 09/13/2018

## 2018-09-20 ENCOUNTER — Ambulatory Visit (INDEPENDENT_AMBULATORY_CARE_PROVIDER_SITE_OTHER): Payer: No Typology Code available for payment source | Admitting: Psychology

## 2018-09-20 DIAGNOSIS — F418 Other specified anxiety disorders: Secondary | ICD-10-CM

## 2018-09-20 NOTE — Progress Notes (Signed)
   THERAPIST PROGRESS NOTE  Session Time: 12.30pm-1.20pm  Participation Level: Active  Behavioral Response: Fairly GroomedAlertaffect wnl  Type of Therapy: Individual Therapy  Treatment Goals addressed: Diagnosis: MDD and goal 1.  Interventions: CBT and Supportive  Summary: Andrea [name] is a 48 y.o. female who presents with affect wnl.  Pt reported that weekend was pretty good.  Pt was excited to report that she was offered job as Personal assistantcensus worker.  Pt feels good about the opportunity to get out of the house and engaged for couple of months and have some income coming in.  Pt discussed grief and feeling guilt that "wasn't there as mother should have been" growing up.  Pt discussed how after stroke parents cared for son and then as wasn't stable decided best for him to continue w/ her parents raising him. Pt was able to reflect and reframe that she did look out for his best interest and was there for him when made the decision not to abort like parents wanted.  Pt expressed that she misses him, wants to be able to see him again.  Pt apologized for emotion but recognized as expression of feeling and ok. Pt expressed felt better to express self.  Suicidal/Homicidal: Nowithout intent/plan  Therapist Response: assessed pt current functioning per pt report. discussed upcoming job. Processed w/pt her emotions and grief and assisted w/ reframing distortion that not a mom to him.  Validated and normalized pt grief and tears as expression of this.  Plan: Return again in 2 weeks.  Diagnosis: MDD   Forde RadonYATES,LEANNE, [facility] 09/20/2018

## 2018-10-05 ENCOUNTER — Encounter (HOSPITAL_COMMUNITY): Payer: Self-pay | Admitting: Psychology

## 2018-10-05 ENCOUNTER — Ambulatory Visit (HOSPITAL_COMMUNITY): Payer: No Typology Code available for payment source | Admitting: Psychology

## 2018-10-05 NOTE — Progress Notes (Signed)
Andrea [name] is a 47 y.o. female patient who didn't show for appointment.  Letter sent.        YATES,LEANNE, LPC 

## 2018-10-18 ENCOUNTER — Encounter (HOSPITAL_COMMUNITY): Payer: Self-pay | Admitting: Psychology

## 2018-10-18 ENCOUNTER — Ambulatory Visit (HOSPITAL_COMMUNITY): Payer: No Typology Code available for payment source | Admitting: Psychology

## 2018-10-18 NOTE — Progress Notes (Signed)
Andrea [name] is a 47 y.o. female patient who didn't show for appointment.  Letter sent.        Dario Yono, LPC 

## 2018-10-19 ENCOUNTER — Ambulatory Visit (INDEPENDENT_AMBULATORY_CARE_PROVIDER_SITE_OTHER): Payer: Self-pay | Admitting: Internal Medicine

## 2018-10-19 ENCOUNTER — Other Ambulatory Visit: Payer: Self-pay

## 2018-10-19 VITALS — BP 137/80 | HR 70 | Temp 97.5°F | Ht 65.0 in | Wt 215.9 lb

## 2018-10-19 DIAGNOSIS — M797 Fibromyalgia: Secondary | ICD-10-CM

## 2018-10-19 DIAGNOSIS — R21 Rash and other nonspecific skin eruption: Secondary | ICD-10-CM | POA: Insufficient documentation

## 2018-10-19 DIAGNOSIS — F418 Other specified anxiety disorders: Secondary | ICD-10-CM

## 2018-10-19 DIAGNOSIS — R599 Enlarged lymph nodes, unspecified: Secondary | ICD-10-CM

## 2018-10-19 DIAGNOSIS — Z79899 Other long term (current) drug therapy: Secondary | ICD-10-CM

## 2018-10-19 DIAGNOSIS — F17210 Nicotine dependence, cigarettes, uncomplicated: Secondary | ICD-10-CM

## 2018-10-19 DIAGNOSIS — K0889 Other specified disorders of teeth and supporting structures: Secondary | ICD-10-CM

## 2018-10-19 MED ORDER — AMITRIPTYLINE HCL 50 MG PO TABS: ORAL_TABLET | ORAL | 2 refills | Status: DC

## 2018-10-19 MED ORDER — HYDROCORTISONE 1 % EX CREA: TOPICAL_CREAM | CUTANEOUS | 1 refills | Status: DC

## 2018-10-19 NOTE — Patient Instructions (Signed)
Thank you for visiting clinic today. I will try finding about your Lyrica, will send a message to Dr. Selena Batten as she is not here today. Continue taking your Cymbalta and amitriptyline. Try seeing a psychiatrist along with your therapist to readjust your depression medications. We will keep an eye on this lymph node under your chin, if you noticed any drastic change in size or consistency please let us know. For your elbow rash, you can try over-the-counter hydrocortisone cream. Please follow-up in 37-month.

## 2018-10-19 NOTE — Progress Notes (Signed)
   CC: For follow-up of her depression and fibromyalgia.  Complaint of a small scaly rash on right elbow and a nodule under the chin.  HPI:  [name] is a 48 y.o. with past medical history as listed below came to the clinic for follow-up of her fibromyalgia and depression.  She was also complaining of a small scaly rash on her right elbow, with occasional itching.  Denies any pain or erythema.  Tried some Vaseline with no relief.  She was also complaining of his small nodule under her chin, she has noticed that for couple of years.  Denies any recent change.  Patient has dental problems and also is a smoker of 1 pack/day.  She was also complaining of worsening of her depression and aches and pains.  She still waiting for a response from patient assistant program for Lyrica.  Please see assessment and plan for her chronic conditions.  Past Medical History:  Diagnosis Date  . Anticardiolipin antibody syndrome (HCC)   . Bipolar affective (HCC)   . Drug-seeking behavior   . Fibromyalgia   . Stroke [facility])    L sided weakness   Review of Systems: Negative except mentioned in HPI.  Physical Exam:  Vitals:   10/19/18 1515  BP: 137/80  Pulse: 70  Temp: (!) 97.5 F (36.4 C)  TempSrc: Oral  SpO2: 98%  Weight: 215 lb 14.4 oz (97.9 kg)  Height: 5\' 5"  (1.651 m)   General: Vital signs reviewed.  Patient is well-developed and well-nourished, in no acute distress and cooperative with exam.  Head: Normocephalic and atraumatic. Eyes: EOMI, conjunctivae normal, no scleral icterus.  Neck: Supple, trachea midline, normal ROM, a small subcentimeter freely mobile submental lymph node. Cardiovascular: RRR, S1 normal, S2 normal, no murmurs, gallops, or rubs. Pulmonary/Chest: Clear to auscultation bilaterally, no wheezes, rales, or rhonchi. Abdominal: Soft, non-tender, non-distended, BS +, no masses, organomegaly, or guarding present.  Musculoskeletal: Multiple tender points along  cervical, lower thoracic and lumbar spinal and paraspinal region, bilateral shoulder blade, bilateral lateral thigh and left lower leg.  No sign of inflammation. Extremities: No lower extremity edema bilaterally,  pulses symmetric and intact bilaterally. No cyanosis or clubbing. Neurological: A&O x3, Strength is normal and symmetric bilaterally, cranial nerve II-XII are grossly intact, no focal motor deficit, sensory intact to light touch bilaterally.  Skin: Warm, dry and intact. No rashes or erythema. Psychiatric: Normal mood and affect. speech and behavior is normal. Cognition and memory are normal.  Assessment & Plan:   See Encounters Tab for problem based charting.  Patient seen with Dr. Oswaldo Done.

## 2018-10-19 NOTE — Assessment & Plan Note (Addendum)
Continue to have multiple tender areas and pain. She has tried gabapentin from her boyfriend in the past with no relief.  Was given a prescription of Lyrica and did fill out forms for patient assistant program, have not heard anything back yet.  We will try finding out her status with patient assistant program for Lyrica. She will continue Cymbalta and amitriptyline. New prescription of amitriptyline was sent to [facility].

## 2018-10-19 NOTE — Assessment & Plan Note (Signed)
She has a mobile subcentimeter submental lymph node under her chin. Patient is a smoker and having multiple dental issues. She denies any difficulty with breathing or swallowing. She denies any tenderness.  -Asked patient to see a dentist. -We will keep an eye on her lymph node, no high risk features at this time. -If that lymph node continued to get increase she might need further investigation and imaging as she is high risk for head and neck malignancy due to her smoking.

## 2018-10-19 NOTE — Assessment & Plan Note (Signed)
She has a 1.5 x 1.5 cm scaly dry area on her right elbow with no erythema or tenderness. She has tried emollient with not much response.  -We will try over-the-counter hydrocortisone twice daily and see the response.

## 2018-10-19 NOTE — Assessment & Plan Note (Signed)
Continue to experience depression with bad and good days. On bad days she just stays at home and does not want to do anything.  Her PHQ 9 score was 17 today. She did follow-up with a psychologist and getting a therapy.  I asked her to follow-up with a psychiatrist and also provided her referral for [facility] at [facility].

## 2018-10-20 NOTE — Progress Notes (Signed)
Internal Medicine Clinic Attending  Case discussed with Dr. Amin at the time of the visit.  We reviewed the resident's history and exam and pertinent patient test results.  I agree with the assessment, diagnosis, and plan of care documented in the resident's note.    

## 2018-10-21 ENCOUNTER — Telehealth: Payer: Self-pay | Admitting: Licensed Clinical Social Worker

## 2018-10-21 NOTE — Telephone Encounter (Signed)
Patient was contacted to schedule appointment through [facility] given recent referral from her PCP. Patient reported interest in receiving counseling services and schedule an appointment to meet with Phineas Semen next week.  -Ortencia Kick, Behavioral Health Intern

## 2018-10-27 ENCOUNTER — Ambulatory Visit: Payer: Self-pay | Admitting: Pharmacist

## 2018-10-27 ENCOUNTER — Ambulatory Visit (INDEPENDENT_AMBULATORY_CARE_PROVIDER_SITE_OTHER): Payer: Self-pay | Admitting: Licensed Clinical Social Worker

## 2018-10-27 ENCOUNTER — Encounter: Payer: Self-pay | Admitting: Licensed Clinical Social Worker

## 2018-10-27 DIAGNOSIS — Z79899 Other long term (current) drug therapy: Secondary | ICD-10-CM

## 2018-10-27 DIAGNOSIS — F418 Other specified anxiety disorders: Secondary | ICD-10-CM

## 2018-10-27 NOTE — BH Specialist Note (Signed)
Integrated Behavioral Health Initial Visit  MRN: [id number] Name: Andrea [name]  Number of Integrated Behavioral Health Clinician visits:: 1/6 Session Start time: 1:08  Session End time: 2:00 Total time: 52 minutes  Type of Service: Integrated Behavioral Health- Individual Interpretor:No.           SUBJECTIVE: Andrea [name] is a 48 y.o. female  whom attneded the session individually.  Patient was referred by Dr.Amin for depression and anxiety. Patient reports the following symptoms/concerns: panic attacks, anxiety almost daily, chronic health issues and pain, grief, depression, and financial insecurities.  Duration of problem: increased over the past four years; Severity of problem: moderate  OBJECTIVE: Mood: Negative, Anxious and Depressed and Affect: Depressed and Tearful Risk of harm to self or others: No plan to harm self or others  LIFE CONTEXT: Family and Social: Patient lives with her boyfriend. Patient has one living daughter. Patient reported her son died tragically four years ago, and the patient is continuing to grieve his loss. Patient reported a strained relationship with her mother, and believes that her mother's expectations increase her depression. Patient avoids most social situations, and only leaves her home if necessary.  School/Work: Patient has not worked since 2003. 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 Patient is anxious about her new job, and feels this is contributing to her anxiety.  Self-Care: Needs improvement. Patient reported she will skip showers, lacks self-care, and sleeps more than she should.  Life Changes: Patient will be beginning a new job this month.   GOALS ADDRESSED: Patient will: 1. Reduce symptoms of: agitation, anxiety, depression and mood instability 2. Increase knowledge and/or ability of: coping skills, healthy habits and stress reduction  3. Demonstrate  ability to: Increase healthy adjustment to current life circumstances, Increase adequate support systems for patient/family and Begin healthy grieving over loss  INTERVENTIONS: Interventions utilized: Mindfulness or Relaxation Training, Brief CBT, Supportive Counseling and Link to [facility]  Standardized Assessments completed: assessed for SI, HI, and self-harm.   ASSESSMENT: Patient currently experiencing increased anxiety. Patient reported she had a panic attack before coming to her appointment today in our office. Patient reported change, meeting new people, and fear of something happening to her family are all triggers for her anxiety.   Patient reported depression symptoms since her son passed four years ago. Patient is continuing to grieve the loss of her son, and is dealing with guilt related to parenting choices. Patient has ongoing family challenges, and feels this contributes to her negative self-talk. Patient has a negative view and reported she has difficulty challenging her negative thoughts. Patient reported feeling fatigue, down, lack of interest in activities, over sleeping, guilt, and feelings of being a failure around 3-4 days per week. Patient does not feel her current medication is assisting in reducing her depression and anxiety. Patient was provided with a Publishing rights manager for psychiatry.    Patient may benefit from weekly outpatient therapy.  Patient has a current therapist at [facility], and plans to see her next week.   PLAN: 1. Follow up with behavioral health clinician on : prn.  2. Referral(s): Community Mental Health Services (LME/Outside Clinic) [facility] Psychiat[facility] was provided to her, or to follow up with [facility] since she is a current patient.    Lysle Rubens, LPC, LCAS

## 2018-11-01 ENCOUNTER — Encounter (HOSPITAL_COMMUNITY): Payer: Self-pay | Admitting: Psychology

## 2018-11-01 ENCOUNTER — Other Ambulatory Visit: Payer: Self-pay | Admitting: Pharmacist

## 2018-11-01 ENCOUNTER — Ambulatory Visit (HOSPITAL_COMMUNITY): Payer: No Typology Code available for payment source | Admitting: Psychology

## 2018-11-01 DIAGNOSIS — M797 Fibromyalgia: Secondary | ICD-10-CM

## 2018-11-01 MED ORDER — PREGABALIN 75 MG PO CAPS: 75.0000 mg | ORAL_CAPSULE | Freq: Three times a day (TID) | ORAL | 2 refills | Status: DC

## 2018-11-02 ENCOUNTER — Encounter (HOSPITAL_COMMUNITY): Payer: Self-pay | Admitting: Psychology

## 2018-11-02 NOTE — Progress Notes (Signed)
Andrea [name] is a 48 y.o. female patient who didn't show for appointment.  This is the 3rd consecutive no show.  Outpatient Therapist Discharge Summary  SHALEI NEYLON    [birth year]   Admission Date: 04/21/18 Discharge Date:  11/02/18 Reason for Discharge:  No shows Diagnosis:  MDD Comments:  Pt referred to community providers  Alfredo Batty, [facility]

## 2018-11-02 NOTE — Progress Notes (Signed)
I will do so today.

## 2018-11-11 MED ORDER — PREGABALIN 75 MG PO CAPS: 75.0000 mg | ORAL_CAPSULE | Freq: Three times a day (TID) | ORAL | 2 refills | Status: DC

## 2018-11-12 NOTE — Progress Notes (Signed)
Where I can find her prescription? There were not in my mailbox.

## 2018-11-15 ENCOUNTER — Ambulatory Visit (HOSPITAL_COMMUNITY): Payer: No Typology Code available for payment source | Admitting: Psychology

## 2018-11-15 NOTE — Progress Notes (Signed)
Hi Dr. Nelson Chimes, sorry for the confusion. I forgot that we sent to the company when we completed the paperwork. Patient already received medication at home.  Thank you

## 2018-11-16 NOTE — Progress Notes (Signed)
Thank you for taking care of her. 

## 2018-11-24 ENCOUNTER — Telehealth: Payer: Self-pay | Admitting: Licensed Clinical Social Worker

## 2018-11-24 NOTE — Telephone Encounter (Signed)
Called patient to inform about providing services via telephone. Patient did not answer. Left voicemail for the patient to contact our office back if she wants to schedule a future appointment with me.

## 2018-11-25 ENCOUNTER — Other Ambulatory Visit: Payer: Self-pay | Admitting: Internal Medicine

## 2018-11-25 DIAGNOSIS — N921 Excessive and frequent menstruation with irregular cycle: Secondary | ICD-10-CM

## 2018-12-10 ENCOUNTER — Other Ambulatory Visit: Payer: Self-pay | Admitting: Internal Medicine

## 2018-12-10 DIAGNOSIS — F418 Other specified anxiety disorders: Secondary | ICD-10-CM

## 2018-12-30 ENCOUNTER — Ambulatory Visit (INDEPENDENT_AMBULATORY_CARE_PROVIDER_SITE_OTHER): Payer: Self-pay | Admitting: Obstetrics and Gynecology

## 2018-12-30 ENCOUNTER — Encounter: Payer: Self-pay | Admitting: Obstetrics and Gynecology

## 2018-12-30 ENCOUNTER — Other Ambulatory Visit: Payer: Self-pay

## 2018-12-30 VITALS — Ht 60.0 in

## 2018-12-30 DIAGNOSIS — Z1231 Encounter for screening mammogram for malignant neoplasm of breast: Secondary | ICD-10-CM

## 2018-12-30 DIAGNOSIS — N926 Irregular menstruation, unspecified: Secondary | ICD-10-CM

## 2018-12-30 DIAGNOSIS — N939 Abnormal uterine and vaginal bleeding, unspecified: Secondary | ICD-10-CM

## 2018-12-30 MED ORDER — MEDROXYPROGESTERONE ACETATE 5 MG PO TABS: 10.0000 mg | ORAL_TABLET | Freq: Every day | ORAL | 1 refills | Status: DC

## 2018-12-30 NOTE — Progress Notes (Signed)
NGYN   CC: irregular.  last cycle lasted about 2 wks ago unsure of LMP in March Pt notes cycles are very heavy Has to wear tampons and pad Pt notes clots that are sometimes "golf ball" in size.  LMP: March unsure of date  Contraception: None  Last pap: 04/12/2018 WNL Mammogram : Never  U/S : N/A

## 2018-12-30 NOTE — Progress Notes (Signed)
   [facility] GYNECOLOGY VISIT ENCOUNTER NOTE  I connected with [name] on 12/30/18 at  2:00 PM EDT by WebEx at home and verified that I am speaking with the correct person using two identifiers.   I discussed the limitations, risks, security and privacy concerns of performing an evaluation and management service by telephone and the availability of in person appointments. I also discussed with the patient that there may be a patient responsible charge related to this service. The patient expressed understanding and agreed to proceed.   History:  Andrea [name] is a 48 y.o. [phone] female being evaluated today for heavy menses. Has been going on for at least a year. She has bleeding that is very heavy that leaks through tampons + pads + clothes. Lasts from 2 days to up to a month. Very irregular, never knows when her period will come. Has gone up to 5 months before without bleeding, then bled for a month after. Has hot flushes, night sweats, at least one year.   She denies any abnormal vaginal discharge, pelvic pain or other concerns.       Past Medical History:  Diagnosis Date  . Anticardiolipin antibody syndrome (HCC)   . Bipolar affective (HCC)   . Drug-seeking behavior   . Fibromyalgia   . Stroke [facility])    L sided weakness   Past Surgical History:  Procedure Laterality Date  . CESAREAN SECTION    . TONSILLECTOMY    . TUBAL LIGATION     The following portions of the patient's history were reviewed and updated as appropriate: allergies, current medications, past family history, past medical history, past social history, past surgical history and problem list.   Health Maintenance:  Normal pap and negative HRHPV on 04/12/18.  Due for mammogram, has never had one.  Review of Systems:  Pertinent items noted in HPI and remainder of comprehensive ROS otherwise negative.  Physical Exam:   General:  Alert, oriented and cooperative. Patient appears to be in no acute  distress.  Mental Status: Normal mood and affect. Normal behavior. Normal judgment and thought content.   Respiratory: Normal respiratory effort, no problems with respiration noted  Rest of physical exam deferred due to type of encounter  Labs and Imaging No results found for this or any previous visit (from the past 336 hour(s)). No results found.     Assessment and Plan:   1. Abnormal uterine bleeding (AUB) Reviewed likely etiology of menopause, possible other etiology including polyp or malignancy, given her symptoms, perimenopause most suspected - recommended EMB to rule out malignancy - will start provera prn in the meantime and return for EMB  2. Irregular periods See above  3. Breast cancer screening by mammogram - MM 3D SCREEN BREAST BILATERAL; Future    I discussed the assessment and treatment plan with the patient. The patient was provided an opportunity to ask questions and all were answered. The patient agreed with the plan and demonstrated an understanding of the instructions.   The patient was advised to call back or seek an in-person evaluation/go to the ED if the symptoms worsen or if the condition fails to improve as anticipated.  I provided 24 minutes of face-to-face via WebEx time during this encounter.   Conan Bowens, MD Center for [facility], [facility]

## 2019-01-18 ENCOUNTER — Telehealth: Payer: Self-pay

## 2019-01-18 NOTE — Telephone Encounter (Signed)
Return pt's call - stated the pain is getting worse. Back pain now radiating down left leg. She's taking Lyrica which does not seem to working. Stated pharmacy gave her pain cream which she has tried also. Requesting something else for pain. Thanks

## 2019-01-18 NOTE — Telephone Encounter (Signed)
Please make an [facility] appointment.

## 2019-01-18 NOTE — Telephone Encounter (Signed)
Requesting to speak with a nurse about pain med. Please call back.  

## 2019-01-18 NOTE — Telephone Encounter (Signed)
Ph rings and rings, no answer

## 2019-01-19 NOTE — Telephone Encounter (Signed)
Thank you :)

## 2019-01-19 NOTE — Telephone Encounter (Signed)
Called pt - informed of Dr Shanda Bumps response. No available appts this week; but she already has an appt on Mon 6/1 @ 1534 PM. Informed to keep this appt, stated she will.

## 2019-01-24 ENCOUNTER — Other Ambulatory Visit: Payer: Self-pay

## 2019-01-24 ENCOUNTER — Ambulatory Visit (INDEPENDENT_AMBULATORY_CARE_PROVIDER_SITE_OTHER): Payer: Self-pay | Admitting: Internal Medicine

## 2019-01-24 ENCOUNTER — Encounter: Payer: Self-pay | Admitting: Internal Medicine

## 2019-01-24 VITALS — BP 119/73 | HR 74 | Temp 98.0°F | Ht 65.0 in | Wt 222.8 lb

## 2019-01-24 DIAGNOSIS — M797 Fibromyalgia: Secondary | ICD-10-CM

## 2019-01-24 DIAGNOSIS — Z79899 Other long term (current) drug therapy: Secondary | ICD-10-CM

## 2019-01-24 DIAGNOSIS — M7062 Trochanteric bursitis, left hip: Secondary | ICD-10-CM

## 2019-01-24 MED ORDER — MELOXICAM 15 MG PO TABS: 15.0000 mg | ORAL_TABLET | Freq: Every day | ORAL | 1 refills | Status: DC

## 2019-01-24 MED ORDER — KETOROLAC TROMETHAMINE 30 MG/ML IJ SOLN
30.0000 mg | Freq: Once | INTRAMUSCULAR | Status: AC
  Administered 2019-01-24: 30 mg via INTRAMUSCULAR

## 2019-01-24 NOTE — Progress Notes (Signed)
   CC: For follow-up of her fibromyalgia.  HPI:  [name] is a 48 y.o. with past medical history as listed below came to the clinic for follow-up of her fibromyalgia.  Patient continues to experience aches and pains.  She is also experiencing worsening pain on left lateral hip, unable to sleep on that side.  She had similar pain few years ago which resolved after some time, per patient this current pain is worse her than her previous one.  She has tried Lyrica and think that it is making her pain worse.  She restarted taking her boyfriend's gabapentin along with Lyrica, amitriptyline and Cymbalta with not much relief.  See assessment and plan for chronic conditions.  Past Medical History:  Diagnosis Date  . Anticardiolipin antibody syndrome (HCC)   . Bipolar affective (HCC)   . Drug-seeking behavior   . Fibromyalgia   . Stroke [facility])    L sided weakness   Review of Systems: Negative except mentioned in HPI.  Physical Exam:  Vitals:   01/24/19 1546  BP: 119/73  Pulse: 74  Temp: 98 F (36.7 C)  TempSrc: Oral  SpO2: 98%  Weight: 222 lb 12.8 oz (101.1 kg)  Height: 5\' 5"  (1.651 m)   General: Vital signs reviewed.  Patient is well-developed and well-nourished, in no acute distress and cooperative with exam.  Head: Normocephalic and atraumatic. Eyes: EOMI, conjunctivae normal, no scleral icterus.  Cardiovascular: RRR, S1 normal, S2 normal, no murmurs, gallops, or rubs. Pulmonary/Chest: Clear to auscultation bilaterally, no wheezes, rales, or rhonchi. Abdominal: Soft, non-tender, non-distended, BS +, Musculoskeletal: Multiple tender spots along her neck, bilateral shoulder, over her spine and paraspinal region.  Tenderness along left trochanteric tuberosity. Extremities: No lower extremity edema bilaterally,  pulses symmetric and intact bilaterally.  Skin: Warm, dry and intact. No rashes or erythema. Psychiatric: Normal mood and affect. speech and behavior is normal.    Assessment & Plan:   See Encounters Tab for problem based charting.  Patient discussed with Dr. Criselda Peaches.

## 2019-01-24 NOTE — Patient Instructions (Signed)
Thank you for visiting clinic today. As we discussed I am referring you to a pain clinic for further management of your pain. I am also giving you an injection of Toradol today to help with your pain. Starting you on Mobic 15 mg daily-see if that will help. Continue taking Cymbalta and amitriptyline. Stop taking Lyrica. Please do not take your boyfriend's gabapentin as it was not helping. Please follow-up in few days for your injection.

## 2019-01-24 NOTE — Assessment & Plan Note (Signed)
Complaint of left lateral hip area pain and exam is more consistent with trochanteric bursitis.  -Follow-up in 1 week for steroid injection when Dr. Mikey Bussing or Dr. Oswaldo Done will be available.

## 2019-01-24 NOTE — Assessment & Plan Note (Signed)
Patient continued to experience worsening of her fibromyalgia pain. We have tried Cymbalta, amitriptyline, gabapentin and Lyrica without any relief. She has tried high-dose ibuprofen with no relief.  -Give her an injection of Toradol 30 mg IM today. -Advised her to stop taking Lyrica and gabapentin if that is not working, she should not be taking both together. -Refer her to a pain management clinic. -Started her on Mobic 15 mg daily to see if that will help.

## 2019-01-25 NOTE — Progress Notes (Signed)
Internal Medicine Clinic Attending  Case discussed with Dr. Amin at the time of the visit.  We reviewed the resident's history and exam and pertinent patient test results.  I agree with the assessment, diagnosis, and plan of care documented in the resident's note.    

## 2019-02-01 ENCOUNTER — Ambulatory Visit: Payer: Self-pay

## 2019-02-02 ENCOUNTER — Other Ambulatory Visit: Payer: Self-pay

## 2019-02-02 ENCOUNTER — Ambulatory Visit: Payer: Self-pay | Admitting: Internal Medicine

## 2019-02-02 DIAGNOSIS — M7062 Trochanteric bursitis, left hip: Secondary | ICD-10-CM

## 2019-02-02 NOTE — Assessment & Plan Note (Addendum)
She was provided with steroid injection at her left greater trochanteric tubercle.  Procedure note. Large bursa steroid injection. After obtaining informed consent. A steroid injection was performed at left greater tubercle using 1% plain Lidocaine and 40 mg of Kenalog. This was well tolerated.

## 2019-02-02 NOTE — Patient Instructions (Signed)
Thank you for visiting clinic today. We gave you an injection to your lateral side of hip with the hope that it will take care of your pain.  If it is functioning well that you should be seeing some difference with the next couple of days.  If that able to take care of your pain we can repeat this injection in 3 to 4 months if needed. Please follow-up with PCP according to your schedule appointment.   Joint Steroid Injection A joint steroid injection is a procedure to relieve swelling and pain in a joint. Steroids are medicines that reduce inflammation. In this procedure, your health care provider uses a syringe and a needle to inject a steroid medicine into a painful and inflamed joint. A pain-relieving medicine (anesthetic) may be injected along with the steroid. In some cases, your health care provider may use an imaging technique such as ultrasound or fluoroscopy to guide the injection. Joints that are often treated with steroid injections include the knee, shoulder, hip, and spine. These injections may also be used in the elbow, ankle, and joints of the hands or feet. You may have joint steroid injections as part of your treatment for inflammation caused by:  Gout.  Rheumatoid arthritis.  Advanced wear-and-tear arthritis (osteoarthritis).  Tendinitis.  Bursitis. Joint steroid injections may be repeated, but having them too often can damage a joint or the skin over the joint. You should not have joint steroid injections less than 6 weeks apart or more than four times a year. Tell a health care provider about:  Any allergies you have.  All medicines you are taking, including vitamins, herbs, eye drops, creams, and over-the-counter medicines.  Any problems you or family members have had with anesthetic medicines.  Any blood disorders you have.  Any surgeries you have had.  Any medical conditions you have.  Whether you are pregnant or may be pregnant. What are the  risks? Generally, this is a safe treatment. However, problems may occur, including:  Infection.  Bleeding.  Allergic reactions to medicines.  Damage to the joint or tissues around the joint.  Thinning of skin or loss of skin color over the joint.  Temporary flushing of the face or chest.  Temporary increase in pain.  Temporary increase in blood sugar.  Failure to relieve inflammation or pain. What happens before the treatment?  You may have imaging tests of your joint.  Ask your health care provider about: ? Changing or stopping your regular medicines. This is especially important if you are taking diabetes medicines or blood thinners. ? Taking medicines such as aspirin and ibuprofen. These medicines can thin your blood. Do not take these medicines unless your health care provider tells you to take them. ? Taking over-the-counter medicines, vitamins, herbs, and supplements.  Ask your health care provider if you can drive yourself home after the procedure. What happens during the treatment?   Your health care provider will position you for the injection and locate the injection site over your joint.  The skin over the joint will be cleaned with a germ-killing soap.  Your health care provider may: ? Spray a numbing solution (topical anesthetic) over the injection site. ? Inject a local anesthetic under the skin above your joint.  The needle will be placed through your skin into your joint. Your health care provider may use imaging to guide the needle to the right spot for the injection. If imaging is used, a special contrast dye may be injected to  confirm that the needle is in the correct location.  The steroid medicine will be injected into your joint.  Anesthetic may be injected along with the steroid. This may be a medicine that relieves pain for a short time (short-acting anesthetic) or for a longer time (long-acting anesthetic).  The needle will be removed, and an  adhesive bandage (dressing) will be placed over the injection site. The procedure may vary among health care providers and hospitals. What can I expect after the treatment?  You will be able to go home after the treatment.  It is normal to feel slight flushing for a few days after the injection.  After the treatment, it is common to have an increase in joint pain after the anesthetic has worn off. This may happen about an hour after a short-acting anesthetic or about 8 hours after a longer-acting anesthetic.  You should begin to feel relief from joint pain and swelling after 24 to 48 hours. Follow these instructions at home: Injection site care  Leave the adhesive dressing over your injection site in place until your health care provider says you can remove it.  Check your injection site every day for signs of infection. Check for: ? Redness, swelling, or pain. ? Fluid or blood. ? Warmth. ? Pus or a bad smell. Activity  Return to your normal activities as told by your health care provider. Ask your health care provider what activities are safe for you. You may be asked to limit activities that put stress on the joint for a few days.  Do joint exercises as told by your health care provider.  Do not take baths, swim, or use a hot tub until your health care provider approves. Managing pain, stiffness, and swelling   If directed, put ice on the joint. ? Put ice in a plastic bag. ? Place a towel between your skin and the bag. ? Leave the ice on for 20 minutes, 2-3 times a day.  Raise (elevate) your joint above the level of your heart when you are sitting or lying down. General instructions  Take over-the-counter and prescription medicines only as told by your health care provider.  Do not use any products that contain nicotine or tobacco, such as cigarettes, e-cigarettes, and chewing tobacco. These can delay joint healing. If you need help quitting, ask your health care  provider.  If you have diabetes, be aware that your blood sugar may be slightly elevated for several days after the injection.  Keep all follow-up visits as told by your health care provider. This is important. Contact a health care provider if you have:  Chills or a fever.  Any signs of infection at your injection site.  Increased pain or swelling or no relief after 2 days. Summary  A joint steroid injection is a treatment to relieve pain and swelling in a joint.  Steroids are medicines that reduce inflammation. Your health care provider may add an anesthetic along with the steroid.  You may have joint steroid injections as part of your arthritis treatment.  Joint steroid injections may be repeated, but having them too often can damage a joint or the skin over the joint.  Contact your health care provider if you have a fever, chills, or signs of infection or if you get no relief from joint pain or swelling. This information is not intended to replace advice given to you by your health care provider. Make sure you discuss any questions you have with your health  care provider. Document Released: 04/13/2018 Document Revised: 04/13/2018 Document Reviewed: 04/13/2018 Elsevier Interactive Patient Education  2019 [employer]  Gluteus Medius Syndrome  Gluteus medius syndrome causes pain on the outside of your hip due to repeated overstretching or overworking of the gluteus medius muscle. This muscle runs from the top, outer part of your pelvis to the top of your thigh bone (femur). This muscle helps you lift your leg to the side and rotate your leg. It also keeps your hip stable and aligned when you are standing, walking, or running. What are the causes? This condition is caused by small injuries to the gluteus medius muscle over time.  It happens from repetitive movements or a sudden increase in the amount or intensity of activity that involves the legs.  It starts with muscle  inflammation and may lead to small tears and scarring in your muscle. What increases the risk? You are more likely to develop this condition if you:  Run on soft or uneven surfaces, such as sand or grass.  Have weakness in your hips and core muscles.  Run long distances.  Increase your time, distance, or intensity of a sport over a short period of time. What are the signs or symptoms? The main symptom of this condition is pain on the outside of your hip with activity.  Usually, the pain will: ? Increase the longer you play sports or run. ? Decrease with rest.  Your hip may also be tender to the touch and you may have muscle spasms. How is this diagnosed? This condition is diagnosed based on your symptoms, medical history, and physical exam.  During the exam, your health care provider will touch different areas of your hip to test for pain.  You may also have imaging studies, such as X-rays and MRI, to rule out other causes of pain. How is this treated? This condition may be treated by a combination of treatments:  Decreasing mileage or time spent doing sports.  Having a coach help you with your running form.  Stretching or strengthening exercises (physical therapy).  Ice or heat therapy.  Massage.  Local electrical stimulation (transcutaneous electrical nerve stimulation, TENS).  Trigger point injection. A steroid or numbing medicine is injected in the area where you have pain. Follow these instructions at home: Managing pain, stiffness, and swelling      If directed, put ice on the injured area. ? Put ice in a plastic bag. ? Place a towel between your skin and the bag. ? Leave the ice on for 20 minutes, 2-3 times a day.  If directed, apply heat to the affected area before you exercise. Use the heat source that your health care provider recommends, such as a moist heat pack or a heating pad. ? Place a towel between your skin and the heat source. ? Leave the heat  on for 20-30 minutes. ? Remove the heat if your skin turns bright red. This is especially important if you are unable to feel pain, heat, or cold. You may have a greater risk of getting burned. Activity  Return to your normal activities as told by your health care provider. Ask your health care provider what activities are safe for you.  Do exercises as told by your physical therapist.  Try not to lie on your painful side. When lying on your other side, put a pillow between your knees to decrease strain on your top hip muscles. General instructions  Take over-the-counter and prescription medicines only as  told by your health care provider.  Keep all follow-up visits as told by your health care provider. This is important. How is this prevented?  Warm up and stretch before being active.  Cool down and stretch after being active.  Give your body time to rest between periods of activity.  Include a variety of exercises and activities in your routine to avoid overuse injuries.  Maintain physical fitness, including: ? Strength. ? Flexibility. ? Cardiovascular fitness. ? Endurance. Contact a health care provider if:  Your pain does not get better or it gets worse. Summary  Gluteus medius syndrome causes pain on the outside of your hip due to repeated overstretching or overworking of the gluteus medius muscle.  This condition is caused by small injuries to the gluteus medius muscle over time.  Treatments may include physical therapy, massage, and trigger point injection. This information is not intended to replace advice given to you by your health care provider. Make sure you discuss any questions you have with your health care provider. Document Released: 08/11/2005 Document Revised: 01/11/2018 Document Reviewed: 01/11/2018 Elsevier Interactive Patient Education  2019 [employer].

## 2019-02-02 NOTE — Progress Notes (Signed)
   CC: For left greater trochanteric steroid injection. HPI:  [name] is a 48 y.o. with past medical history as listed below came to the clinic to get a steroid injection on her left greater trochanteric tubercle as she is experiencing trochanteric bursitis and fail conservative measures.  Past Medical History:  Diagnosis Date  . Anticardiolipin antibody syndrome ([place])   . Bipolar affective ([place])   . Drug-seeking behavior   . Fibromyalgia   . Stroke [facility])    L sided weakness   Review of Systems: Negative except mentioned in HPI  Physical Exam:  Vitals:   02/02/19 1336  BP: 104/72  Pulse: 79  Temp: 98.7 F (37.1 C)  TempSrc: Oral  SpO2: 98%  Weight: 225 lb 6.4 oz (102.2 kg)  Height: 5\' 5"  (1.651 m)   General: Vital signs reviewed.  Patient is well-developed and well-nourished, in no acute distress and cooperative with exam.  Head: Normocephalic and atraumatic. Cardiovascular: RRR, S1 normal, S2 normal, no murmurs, gallops, or rubs. Pulmonary/Chest: Clear to auscultation bilaterally, no wheezes, rales, or rhonchi. Abdominal: Soft, non-tender, non-distended, BS +, Musculoskeletal.  Tenderness along left greater trochanteric area, negative Trendelenburg sign.  Extremities: No lower extremity edema bilaterally,  pulses symmetric and intact bilaterally. No cyanosis or clubbing.   Assessment & Plan:   See Encounters Tab for problem based charting.  Patient seen with Dr. Evette Doffing.

## 2019-02-03 NOTE — Progress Notes (Signed)
Internal Medicine Clinic Attending  I saw and evaluated the patient.  I personally confirmed the key portions of the history and exam documented by Dr. Amin and I reviewed pertinent patient test results.  The assessment, diagnosis, and plan were formulated together and I agree with the documentation in the resident's note.  I was present for the entirety of the procedure.  

## 2019-02-08 ENCOUNTER — Encounter: Payer: Self-pay | Admitting: Physical Medicine & Rehabilitation

## 2019-02-20 ENCOUNTER — Encounter: Payer: Self-pay | Admitting: *Deleted

## 2019-02-21 ENCOUNTER — Encounter: Payer: No Typology Code available for payment source | Admitting: Physical Medicine & Rehabilitation

## 2019-03-16 ENCOUNTER — Other Ambulatory Visit: Payer: Self-pay

## 2019-03-16 DIAGNOSIS — F418 Other specified anxiety disorders: Secondary | ICD-10-CM

## 2019-03-16 MED ORDER — DULOXETINE HCL 30 MG PO CPEP: 30.0000 mg | ORAL_CAPSULE | Freq: Two times a day (BID) | ORAL | 0 refills | Status: DC

## 2019-03-16 MED ORDER — MELOXICAM 15 MG PO TABS: 15.0000 mg | ORAL_TABLET | Freq: Every day | ORAL | 0 refills | Status: DC

## 2019-03-16 NOTE — Telephone Encounter (Signed)
CYMBALTA 30 MG capsule, AND meloxicam (MOBIC) 15 MG tablet is not at the pharmacy.

## 2019-03-28 ENCOUNTER — Encounter: Payer: Self-pay | Admitting: *Deleted

## 2019-04-04 ENCOUNTER — Encounter: Payer: Self-pay | Admitting: Internal Medicine

## 2019-04-04 ENCOUNTER — Ambulatory Visit (INDEPENDENT_AMBULATORY_CARE_PROVIDER_SITE_OTHER): Payer: No Typology Code available for payment source | Admitting: Internal Medicine

## 2019-04-04 ENCOUNTER — Encounter (INDEPENDENT_AMBULATORY_CARE_PROVIDER_SITE_OTHER): Payer: Self-pay

## 2019-04-04 VITALS — BP 122/63 | HR 69 | Temp 98.4°F | Wt 229.4 lb

## 2019-04-04 DIAGNOSIS — R635 Abnormal weight gain: Secondary | ICD-10-CM

## 2019-04-04 DIAGNOSIS — F419 Anxiety disorder, unspecified: Secondary | ICD-10-CM

## 2019-04-04 DIAGNOSIS — R5382 Chronic fatigue, unspecified: Secondary | ICD-10-CM

## 2019-04-04 DIAGNOSIS — Z79899 Other long term (current) drug therapy: Secondary | ICD-10-CM

## 2019-04-04 DIAGNOSIS — F418 Other specified anxiety disorders: Secondary | ICD-10-CM

## 2019-04-04 DIAGNOSIS — Z791 Long term (current) use of non-steroidal anti-inflammatories (NSAID): Secondary | ICD-10-CM

## 2019-04-04 DIAGNOSIS — F32A Depression, unspecified: Secondary | ICD-10-CM

## 2019-04-04 DIAGNOSIS — Z720 Tobacco use: Secondary | ICD-10-CM | POA: Insufficient documentation

## 2019-04-04 DIAGNOSIS — K0889 Other specified disorders of teeth and supporting structures: Secondary | ICD-10-CM

## 2019-04-04 DIAGNOSIS — M797 Fibromyalgia: Secondary | ICD-10-CM

## 2019-04-04 DIAGNOSIS — F329 Major depressive disorder, single episode, unspecified: Secondary | ICD-10-CM

## 2019-04-04 DIAGNOSIS — Z6838 Body mass index (BMI) 38.0-38.9, adult: Secondary | ICD-10-CM

## 2019-04-04 DIAGNOSIS — R5383 Other fatigue: Secondary | ICD-10-CM

## 2019-04-04 NOTE — Assessment & Plan Note (Signed)
Patient has had depression since her son's death 4 years ago.  Has not seen a psychiatrist. Given the patient's persistent symptoms, sleep disturbances, and PHQ9 score of 19, she would likely benefit from a psych referral  for management and medication optimization.   - Referral placed to [place] with Dessie Coma who will direct psychiatrist referral given orange card status.  -Continue Cymbalta 30 mg

## 2019-04-04 NOTE — Progress Notes (Signed)
CC: Chronic fatigue  HPI: [name] is a 48 y.o. with past medical history as noted below who presents today primarily for chronic fatigue.   Today the patient complains of increased fatigue, depression, and sleep disturbances. She says that this is been a problem for her for the last 4 years since her son died in a car accident. When she describes her sleep routine, she says she tries to go to bed at night but when she closes her eyes she hears the phone call telling her son had died, which continues to cause her great distress. Her sleep patterns are variable, sometimes falling asleep very early or sometimes only sleeping in the day. Does not currently take sleep medicine. Used to take Xanex for sleep disturbances but hasn't taken any lately.   To relieve stress, the patient says that she smokes cigarettes.  In the past she has been able to quit during her pregnancies, but had picked up the habit again afterwards.  She has thought about quitting permanently, however she does not want to quit at this moment.  She is amendable to talking about cessation at the next visit.  Of note, PHQ9 = 19 today. No SI indicated. She did endorse about a 30 lbs weight gain over the last few months. She says this is due to the quarantine for COVID-19.   Past Medical History:  Diagnosis Date  . Anticardiolipin antibody syndrome ([place])   . Bipolar affective ([place])   . Drug-seeking behavior   . Fibromyalgia   . Stroke [facility])    L sided weakness   Review of Systems: All systems reviewed and are otherwise negative unless noted in the HPI  Physical Exam:  Vitals:   04/04/19 1508  BP: 122/63  Pulse: 69  Temp: 98.4 F (36.9 C)  TempSrc: Oral  SpO2: 99%  Weight: 229 lb 6.4 oz (104.1 kg)    Physical Exam Vitals signs reviewed.  Constitutional:      General: She is not in acute distress.    Appearance: Normal appearance. She is obese. She is not ill-appearing, toxic-appearing or diaphoretic.   HENT:     Head: Normocephalic and atraumatic.     Mouth/Throat:     Pharynx: No oropharyngeal exudate or posterior oropharyngeal erythema.     Comments: Poor dentition  Eyes:     General: No scleral icterus.       Right eye: No discharge.        Left eye: No discharge.     Extraocular Movements: Extraocular movements intact.  Neck:     Musculoskeletal: Neck supple. No muscular tenderness.  Cardiovascular:     Rate and Rhythm: Normal rate and regular rhythm.     Pulses: Normal pulses.     Heart sounds: Normal heart sounds. No murmur. No friction rub. No gallop.   Pulmonary:     Effort: Pulmonary effort is normal. No respiratory distress.     Breath sounds: Normal breath sounds. No wheezing or rales.  Abdominal:     General: Abdomen is flat. Bowel sounds are normal. There is no distension.     Palpations: Abdomen is soft.     Tenderness: There is no abdominal tenderness. There is no guarding.  Musculoskeletal: Normal range of motion.     Right lower leg: No edema.     Left lower leg: No edema.  Skin:    General: Skin is warm.     Coloration: Skin is not jaundiced.  Neurological:  General: No focal deficit present.     Mental Status: She is alert and oriented to person, place, and time.  Psychiatric:     Comments: Tearful, depressed affect especially when talking about son.    Assessment & Plan:   See Encounters Tab for problem based charting.  Patient seen with Dr. Sandre Kitty

## 2019-04-04 NOTE — Patient Instructions (Addendum)
Thank you for visiting [place] in clinic today.  Below is a summary of what we discussed:  1.  Chronic fatigue - We ran some blood tests (CBC, BMP, TSH) to see if there is medical reason for the fatigue. -We will follow-up the results with you  2.  Depression - Continue taking Cymbalta 30 mg - We have ordered a referral to Dessie Coma, a therapist at [facility], who will help coordinate a referral to a psychiatrist that is compatible with the orange card coverage.  3.  Fibromyalgia - Please call PM&R at [phone] to schedule an appointment   If you have any questions or concerns please feel free to reach out to [place]. Please schedule a follow up appointment in 2 months.

## 2019-04-04 NOTE — Assessment & Plan Note (Signed)
Patient notes that she has quit smoking before with her previous pregnancies, however started smoking again afterwards.  She currently smokes cigarettes now to decrease her anxiety, however acknowledges that she needs to quit.  She does not want to talk about cessation at this appointment, however is open to talking about it at the next. - Discuss smoking cessation at next visit

## 2019-04-04 NOTE — Assessment & Plan Note (Signed)
The patient says her fibromyalgia pain has not improved.  She has tried amitriptyline, gabapentin, and Lyrica without any relief.  She is currently on Mobic 15 mg daily with no benefit.  She notes that her pain seems to correlate with her mood.  If her mood is down her pain is worse.  Patient has been provided with a PM&R referral in the past, and had an appointment on 6/29.  The patient canceled this appointment but is amendable to making another one in the future. - Encouraged patient to reschedule appointment with PM&R.  Phone number provided on instructions - Continue Mobic 15 mg daily as needed

## 2019-04-04 NOTE — Assessment & Plan Note (Signed)
Patient says that her energy level has been decreased chronically, however it has gotten a little worse in the last couple weeks. I believe this is likely due to her underlying depression and irregular sleep patterns. We have placed a referral to Behavioral Health to help refer her to an appropriate psychaitrist, however organic causes of fatiuge must be ruled out such as hypothyroidism, anemia or electrolyte abnormalities. -Obtain CBC,CMP, TSH today

## 2019-04-05 LAB — CBC
Hematocrit: 40 % (ref 34.0–46.6)
Hemoglobin: 13.3 g/dL (ref 11.1–15.9)
MCH: 29.6 pg (ref 26.6–33.0)
MCHC: 33.3 g/dL (ref 31.5–35.7)
MCV: 89 fL (ref 79–97)
Platelets: 237 10*3/uL (ref 150–450)
RBC: 4.5 x10E6/uL (ref 3.77–5.28)
RDW: 14.4 % (ref 11.7–15.4)
WBC: 8.8 10*3/uL (ref 3.4–10.8)

## 2019-04-06 LAB — CMP14 + ANION GAP
ALT: 18 IU/L (ref 0–32)
AST: 15 IU/L (ref 0–40)
Albumin/Globulin Ratio: 1.6 (ref 1.2–2.2)
Albumin: 3.8 g/dL (ref 3.8–4.8)
Alkaline Phosphatase: 54 IU/L (ref 39–117)
Anion Gap: 17 mmol/L (ref 10.0–18.0)
BUN/Creatinine Ratio: 24 — ABNORMAL HIGH (ref 9–23)
BUN: 20 mg/dL (ref 6–24)
Bilirubin Total: <0.2 mg/dL (ref 0.0–1.2)
CO2: 17 mmol/L — ABNORMAL LOW (ref 20–29)
Calcium: 8.6 mg/dL — ABNORMAL LOW (ref 8.7–10.2)
Chloride: 105 mmol/L (ref 96–106)
Creatinine, Ser: 0.84 mg/dL (ref 0.57–1.00)
GFR calc Af Amer: 95 mL/min/{1.73_m2} (ref >59)
GFR calc non Af Amer: 82 mL/min/{1.73_m2} (ref >59)
Globulin, Total: 2.4 g/dL (ref 1.5–4.5)
Glucose: 87 mg/dL (ref 65–99)
Potassium: 4.4 mmol/L (ref 3.5–5.2)
Sodium: 139 mmol/L (ref 134–144)
Total Protein: 6.2 g/dL (ref 6.0–8.5)

## 2019-04-06 LAB — TSH: TSH: 1.99 u[IU]/mL (ref 0.450–4.500)

## 2019-04-08 ENCOUNTER — Telehealth: Payer: Self-pay | Admitting: Licensed Clinical Social Worker

## 2019-04-08 NOTE — Telephone Encounter (Signed)
Patient was called due to a referral from her doctor (1st attempt). Pt did not answer, and a vm was left for the pt to contact our office.

## 2019-04-11 NOTE — Progress Notes (Signed)
Internal Medicine Clinic Attending  I saw and evaluated the patient.  I personally confirmed the key portions of the history and exam documented by Dr. Alexander and I reviewed pertinent patient test results.  The assessment, diagnosis, and plan were formulated together and I agree with the documentation in the resident's note.  Alexander Raines, M.D., Ph.D.  

## 2019-04-12 ENCOUNTER — Telehealth: Payer: Self-pay | Admitting: Internal Medicine

## 2019-04-12 NOTE — Telephone Encounter (Signed)
rtc x 2, busy both times

## 2019-04-12 NOTE — Telephone Encounter (Signed)
Pt calling to report a possible exposure to Covid 19.  Pls call patient back.

## 2019-04-13 ENCOUNTER — Telehealth: Payer: Self-pay | Admitting: Licensed Clinical Social Worker

## 2019-04-13 NOTE — Telephone Encounter (Signed)
Pt was called to discuss referral from her doctor (2nd attempt). Pt did not answer and a vm was left for the patient. A letter will be mailed today.

## 2019-04-13 NOTE — Telephone Encounter (Signed)
Pt states she is not sure she has been exposed, a friend's test came back as a false positive and has been tested for the 2nd time, she will call back when she knows more

## 2019-04-19 ENCOUNTER — Ambulatory Visit: Payer: No Typology Code available for payment source | Admitting: Licensed Clinical Social Worker

## 2019-04-19 ENCOUNTER — Other Ambulatory Visit: Payer: Self-pay

## 2019-04-20 ENCOUNTER — Other Ambulatory Visit: Payer: Self-pay

## 2019-04-20 ENCOUNTER — Encounter: Payer: Self-pay | Admitting: Licensed Clinical Social Worker

## 2019-04-20 ENCOUNTER — Ambulatory Visit (INDEPENDENT_AMBULATORY_CARE_PROVIDER_SITE_OTHER): Payer: No Typology Code available for payment source | Admitting: Licensed Clinical Social Worker

## 2019-04-20 DIAGNOSIS — F329 Major depressive disorder, single episode, unspecified: Secondary | ICD-10-CM

## 2019-04-20 DIAGNOSIS — F32A Depression, unspecified: Secondary | ICD-10-CM

## 2019-04-20 NOTE — BH Specialist Note (Signed)
Integrated Behavioral Health Visit via Telemedicine (Telephone)  04/20/2019 Andrea [name] [id number]   Session Start time: 11:35  Session End time: 12:05 Total time: 30 minutes  Referring Provider: Dr. Sheppard Coil Type of Visit: Telephonic Patient location: home [facility] Provider location: office All persons participating in visit: patient and [facility]  Confirmed patient's address: Yes  Confirmed patient's phone number: Yes  Any changes to demographics: No   Discussed confidentiality: Yes    The following statements were read to the patient and/or legal guardian that are established with the [facility] Provider.  "The purpose of this phone visit is to provide behavioral health care while limiting exposure to the coronavirus (COVID19).  There is a possibility of technology failure and discussed alternative modes of communication if that failure occurs."  "By engaging in this telephone visit, you consent to the provision of healthcare.  Additionally, you authorize for your insurance to be billed for the services provided during this telephone visit."   Patient and/or legal guardian consented to telephone visit: Yes   PRESENTING CONCERNS: Patient and/or family reports the following symptoms/concerns: panic attacks, anxiety, grief, depression, and chronic health issues.  Duration of problem: increased over the past four years; Severity of problem: moderate  GOALS ADDRESSED: Patient will: 1.  Reduce symptoms of: anxiety, depression, insomnia and grief.  2.  Increase knowledge and/or ability of: coping skills, healthy habits and stress reduction  3.  Demonstrate ability to: Increase healthy adjustment to current life circumstances, Increase adequate support systems for patient/family and Begin healthy grieving over loss  INTERVENTIONS: Interventions utilized:  Motivational Interviewing, Behavioral Activation, Brief CBT and Supportive Counseling Standardized Assessments completed:  assessed for SI, HI, and self-harm.  ASSESSMENT: Patient currently experiencing ongoing depression. Patient is having issues with sleep, difficulty remembering, depression, and communicating. Patient reported that at times when she is communicating her words do not come out correctly. Patient reported that she will have words in her mind, but they come out differently. Patient continues to replay the phone call that she received about her son passing. Patient reported the traumatic replay of her son's passing at night time, and she believes this is why she cannot sleep. Patient reported that she is only sleeping around 3-4 hours per night, and does not take naps during the day. Patient believes that she has Sleep Apnea, and is supposed to be getting a CPAP machine. Patient reported this happens as soon as she closes her eyes. Patient agreed to try solutions provided by the therapist when she gets at bed at night to rest her mind. Patient agreed to call [facility] to set up an appointment with the psychiatrist.   Patient is still having panic attacks, but they have decreased since her last session in March, 2020. Patient is unaware what triggers her to have panic attacks.   Patient may benefit from outpatient counseling.  PLAN: 1. Follow up with behavioral health clinician on : one week.   Dessie Coma, [facility], [place]

## 2019-04-26 ENCOUNTER — Telehealth: Payer: Self-pay | Admitting: *Deleted

## 2019-04-26 NOTE — Telephone Encounter (Signed)
CMA attempted to reach out to patient regarding her cpap machine-no answer.  Unfortunately, pt lacks insurance and the only program available is no longer assisting pt's with cpap due to pandemic.  Message left on pt's recorder.

## 2019-04-26 NOTE — Telephone Encounter (Signed)
-----   Message from [place], [facility] sent at 04/20/2019 12:10 PM EDT ----- Regarding: please call Hello.  I just spoke with this patient, and she would like some guidance on her sleep study (performed in 2019). This patient was told she has Sleep apnea, and should be using a CPAP machine, but was never provided with one.  Would you mind contacting her?   Thanks [employer]

## 2019-04-28 ENCOUNTER — Ambulatory Visit: Payer: No Typology Code available for payment source | Admitting: Licensed Clinical Social Worker

## 2019-04-28 NOTE — Addendum Note (Signed)
Addended by: Hulan Fray on: 04/28/2019 06:16 PM   Modules accepted: Orders

## 2019-05-27 ENCOUNTER — Other Ambulatory Visit: Payer: Self-pay | Admitting: Internal Medicine

## 2019-06-02 ENCOUNTER — Other Ambulatory Visit: Payer: Self-pay | Admitting: Internal Medicine

## 2019-06-02 DIAGNOSIS — F418 Other specified anxiety disorders: Secondary | ICD-10-CM

## 2019-07-12 ENCOUNTER — Other Ambulatory Visit: Payer: Self-pay

## 2019-07-12 ENCOUNTER — Ambulatory Visit (INDEPENDENT_AMBULATORY_CARE_PROVIDER_SITE_OTHER): Payer: No Typology Code available for payment source | Admitting: *Deleted

## 2019-07-12 DIAGNOSIS — Z23 Encounter for immunization: Secondary | ICD-10-CM

## 2019-08-08 ENCOUNTER — Ambulatory Visit: Payer: No Typology Code available for payment source

## 2019-09-19 ENCOUNTER — Other Ambulatory Visit: Payer: Self-pay

## 2019-09-19 ENCOUNTER — Encounter: Payer: Self-pay | Admitting: Internal Medicine

## 2019-09-19 ENCOUNTER — Ambulatory Visit (INDEPENDENT_AMBULATORY_CARE_PROVIDER_SITE_OTHER): Payer: No Typology Code available for payment source | Admitting: Internal Medicine

## 2019-09-19 VITALS — BP 116/69 | HR 91 | Temp 98.7°F | Wt 241.8 lb

## 2019-09-19 DIAGNOSIS — N921 Excessive and frequent menstruation with irregular cycle: Secondary | ICD-10-CM

## 2019-09-19 DIAGNOSIS — Z720 Tobacco use: Secondary | ICD-10-CM

## 2019-09-19 DIAGNOSIS — N939 Abnormal uterine and vaginal bleeding, unspecified: Secondary | ICD-10-CM

## 2019-09-19 DIAGNOSIS — L309 Dermatitis, unspecified: Secondary | ICD-10-CM

## 2019-09-19 DIAGNOSIS — Z79899 Other long term (current) drug therapy: Secondary | ICD-10-CM

## 2019-09-19 DIAGNOSIS — Z1231 Encounter for screening mammogram for malignant neoplasm of breast: Secondary | ICD-10-CM

## 2019-09-19 DIAGNOSIS — F419 Anxiety disorder, unspecified: Secondary | ICD-10-CM

## 2019-09-19 DIAGNOSIS — F418 Other specified anxiety disorders: Secondary | ICD-10-CM

## 2019-09-19 DIAGNOSIS — M797 Fibromyalgia: Secondary | ICD-10-CM

## 2019-09-19 DIAGNOSIS — F17210 Nicotine dependence, cigarettes, uncomplicated: Secondary | ICD-10-CM

## 2019-09-19 DIAGNOSIS — N6314 Unspecified lump in the right breast, lower inner quadrant: Secondary | ICD-10-CM

## 2019-09-19 DIAGNOSIS — F329 Major depressive disorder, single episode, unspecified: Secondary | ICD-10-CM

## 2019-09-19 MED ORDER — VARENICLINE TARTRATE 0.5 MG PO TABS: ORAL_TABLET | ORAL | 0 refills | Status: AC

## 2019-09-19 MED ORDER — HYDROCORTISONE 1 % EX CREA: TOPICAL_CREAM | CUTANEOUS | 1 refills | Status: AC

## 2019-09-19 NOTE — Progress Notes (Signed)
   CC: Depression follow up  HPI:  Andrea [name] is a 49 y.o. F with a PMHx as noted below who presents today for depression follow up. Since our last visit in August, the patient started Cymbalta 60 mg daily and had therapy sessions with Lysle Rubens.  Patient states she was referred to [facility] but did not attend because her son who passed away from suicide used to go there.  Of note, pt reports having breast pain in the R breast about 1 week or 2 ago. Does not radiate. Hasn't noticed anything making it better or worse. Denies changes in the shape of her breast, but does feel something hard from time to time.   Pt states that she smokes about a pack a day.    Mammogram: None on file Flu shot: Recieved  Past Medical History:  Diagnosis Date  . Anticardiolipin antibody syndrome (HCC)   . Bipolar affective (HCC)   . Drug-seeking behavior   . Fibromyalgia   . Stroke [facility])    L sided weakness   Review of Systems:  All systems were reviewed and are otherwise negative unless mentioned in the HPI.  Physical Exam:  Vitals:   09/19/19 1335  BP: 116/69  Pulse: 91  Temp: 98.7 F (37.1 C)  TempSrc: Oral  SpO2: 98%  Weight: 241 lb 12.8 oz (109.7 kg)    Physical Exam Constitutional:      General: She is not in acute distress.    Appearance: She is obese. She is not ill-appearing, toxic-appearing or diaphoretic.  HENT:     Head: Normocephalic and atraumatic.  Eyes:     Extraocular Movements: Extraocular movements intact.  Cardiovascular:     Rate and Rhythm: Normal rate and regular rhythm.     Pulses: Normal pulses.     Heart sounds: Normal heart sounds. No murmur. No friction rub. No gallop.   Pulmonary:     Effort: Pulmonary effort is normal. No respiratory distress.     Breath sounds: Normal breath sounds. No wheezing or rales.  Chest:     Chest wall: No mass.     Breasts:        Right: Mass and tenderness present. No swelling, bleeding, inverted nipple, nipple  discharge or skin change.        Left: Normal. No swelling, bleeding, inverted nipple, mass, nipple discharge, skin change or tenderness.    Abdominal:     Palpations: Abdomen is soft.     Tenderness: There is no abdominal tenderness.  Lymphadenopathy:     Upper Body:     Right upper body: No supraclavicular adenopathy.     Left upper body: No supraclavicular adenopathy.  Neurological:     General: No focal deficit present.     Mental Status: She is alert. Mental status is at baseline.  Psychiatric:     Comments: Emotionally labile, tears up during visit    Assessment & Plan:   See Encounters Tab for problem based charting.  Patient discussed with Dr. Sandre Kitty

## 2019-09-19 NOTE — Patient Instructions (Addendum)
Thank you for visiting [place] in clinic today.  Below is a summary of what we discussed:  1.  Depression -Start taking Cymbalta 90 mg once a day. -I put in another referral to Lysle Rubens to see what other resources are available outside of [place].  2.  Smoking -Start taking this medication Chantix.  It should help decrease the cravings of smoking.  3.  Breast lump -I put in a referral for you to get a mammogram.  If you have not had an appointment set up within 2 weeks of our appointment today, please let us know so we can help you.   4.  Follow-up -Please schedule an appointment in 1 month.  If you have any questions or concerns, please feel free to reach out to [place].  It was a pleasure to see you today!

## 2019-09-19 NOTE — Assessment & Plan Note (Signed)
Patient has not had uterine bleeding in the last couple months, however she has a history of heavy vaginal bleeding.  She did see Dr. Earlene Plater in the past and a telehealth visit at the beginning of the pandemic but did not follow-up. -Referral placed for Dr. Earlene Plater to follow-up

## 2019-09-19 NOTE — Assessment & Plan Note (Signed)
Patient is in the contemplative stage of looking cessation.  She is continue to smoke about a pack a day since her last visit in August.  Patient is amendable to trying medication to decrease smoking. -Prescribed Chantix

## 2019-09-19 NOTE — Assessment & Plan Note (Addendum)
Patient has never had a mammogram done before.  She has a tender 2 to 3 cm mass palpated at the right medial breast.  Denies having any recent weight loss, change in appetite, or other B symptoms associated with malignancy. -Mammogram referral placed

## 2019-09-19 NOTE — Assessment & Plan Note (Signed)
Patient states her fibromyalgia pain is about the same as it was back in August. -Increase Cymbalta to 90 mg daily

## 2019-09-19 NOTE — Assessment & Plan Note (Addendum)
Patient states she has not noticed too much of a difference with the Cymbalta as of yet.  I explained to the patient that there is room to increase the dose on her Cymbalta and she is in agreement to continue taking it.  She also states that she talked with Lysle Rubens, and was referred to [facility] but did not go because her son who passed 4 years ago used to go there. -Increase Cymbalta to 90 mg daily -Placed another referral to Lysle Rubens for other psychiatric resources -Follow-up in 1 month

## 2019-09-22 ENCOUNTER — Other Ambulatory Visit (HOSPITAL_COMMUNITY): Payer: Self-pay

## 2019-09-22 DIAGNOSIS — N644 Mastodynia: Secondary | ICD-10-CM

## 2019-09-22 DIAGNOSIS — N63 Unspecified lump in unspecified breast: Secondary | ICD-10-CM

## 2019-09-27 ENCOUNTER — Encounter: Payer: Self-pay | Admitting: Licensed Clinical Social Worker

## 2019-09-27 ENCOUNTER — Telehealth: Payer: Self-pay | Admitting: Licensed Clinical Social Worker

## 2019-09-27 NOTE — Telephone Encounter (Signed)
Patient was called to provide detailed information about resources. Patient did not answer, and a vm was left for the patient. Vm included the phone number, name of facility, and information to make an appointment. A letter will also be mailed.

## 2019-10-03 ENCOUNTER — Ambulatory Visit: Payer: No Typology Code available for payment source

## 2019-10-13 ENCOUNTER — Ambulatory Visit: Payer: No Typology Code available for payment source

## 2019-10-13 ENCOUNTER — Other Ambulatory Visit: Payer: No Typology Code available for payment source

## 2019-10-24 ENCOUNTER — Encounter: Payer: No Typology Code available for payment source | Admitting: Internal Medicine

## 2019-10-27 ENCOUNTER — Ambulatory Visit: Payer: Self-pay | Admitting: Student

## 2019-10-27 ENCOUNTER — Other Ambulatory Visit: Payer: Self-pay

## 2019-10-27 VITALS — BP 110/72 | Temp 98.0°F | Wt 236.0 lb

## 2019-10-27 DIAGNOSIS — Z1239 Encounter for other screening for malignant neoplasm of breast: Secondary | ICD-10-CM

## 2019-10-27 NOTE — Progress Notes (Signed)
Andrea [name] is a 49 y.o. female who presents to [facility] clinic today with complaint of pain and a mass in her right breast. She has had the pain for about a year; she does not know if the mass is getting bigger or not. She reports that she also feels it on the left side in the same area. She does not know if its because of her fibromyalgia that she feels.    Pap Smear: Pap not smear completed today. Last Pap smear was [name] at 04/12/2018 and was normal. Per patient has no history of an abnormal Pap smear. Last Pap smear result is available in Epic.   Physical exam: Breasts Breasts symmetrical. No skin abnormalities bilateral breasts. No nipple retraction bilateral breasts. No nipple discharge bilateral breasts. No lymphadenopathy. Right breast at 3 oclock feels like a hardened mass with no discrete borders; it is tender to palpation. No no nipple discharge. Left breast at 9 o'clock has similar hardneed mass with no discrete borders, also tender to palpation. No nipple discharge.   Pelvic/Bimanual Pap is not indicated today    Smoking History: Patient has is a current smoker at 1 packs per day She was referred to quit line.    Patient Navigation: Patient education provided. Access to services provided for patient through B[facility]CP program.   Colorectal Cancer Screening: Per patient has never had colonoscopy completed No complaints today.    Breast and Cervical Cancer Risk Assessment: Patient does not have family history of breast cancer, known genetic mutations, or radiation treatment to the chest before age 21. Patient does not have history of cervical dysplasia, immunocompromised, or DES exposure in-utero.  Risk Assessment    Risk Scores      10/27/2019   Last edited by: Narda Rutherford, LPN   5-year risk: 0.8 %   Lifetime risk: 8.3 %          A: B[facility]CP exam without pap smear Complaint of bilateral breast pain.   P: Referred [facility] to [facility] for a diagnostic mammogram. Appointment scheduled 10-28-2019  Andrea [name], [place] 10/27/2019 3:55 PM

## 2019-10-28 ENCOUNTER — Other Ambulatory Visit: Payer: Self-pay | Admitting: Obstetrics and Gynecology

## 2019-10-28 ENCOUNTER — Ambulatory Visit
Admission: RE | Admit: 2019-10-28 | Discharge: 2019-10-28 | Disposition: A | Discharge status: Home / Self Care | Payer: No Typology Code available for payment source | Source: Ambulatory Visit | Attending: Obstetrics and Gynecology | Admitting: Obstetrics and Gynecology

## 2019-10-28 DIAGNOSIS — N644 Mastodynia: Secondary | ICD-10-CM

## 2019-10-28 DIAGNOSIS — N63 Unspecified lump in unspecified breast: Secondary | ICD-10-CM

## 2019-10-30 NOTE — Progress Notes (Deleted)
   CC: Depression follow up   HPI: [name] is a 49 y.o. with a hx as noted below who presents for depression follow up. Please refer to the problem based assessment and plan for further details.   Increased Cymbalta to 90 mg daily Dr. Earlene Plater -OBGYN referral placed last visit Chantix for smoking cessation  Mammogram: Completed 10/2019, negative Flu shot: Recieved  Past Medical History:  Diagnosis Date  . Anticardiolipin antibody syndrome (HCC)   . Bipolar affective (HCC)   . Drug-seeking behavior   . Fibromyalgia   . Stroke [facility])    L sided weakness   Review of Systems:  All systems reviewed and are otherwise negative unless mentioned in the HPI.  Physical Exam:  There were no vitals filed for this visit.  Physical Exam  Assessment & Plan:   See Encounters Tab for problem based charting.  Patient discussed with Dr. {NAMES:[id number]::"Butcher","Guilloud","Hoffman","Mullen","Narendra","Raines","Vincent"}

## 2019-10-31 ENCOUNTER — Encounter: Payer: No Typology Code available for payment source | Admitting: Internal Medicine

## 2019-11-07 ENCOUNTER — Encounter: Payer: No Typology Code available for payment source | Admitting: Internal Medicine

## 2019-11-07 ENCOUNTER — Telehealth: Payer: Self-pay | Admitting: Internal Medicine

## 2019-11-07 ENCOUNTER — Ambulatory Visit: Payer: No Typology Code available for payment source

## 2019-11-07 ENCOUNTER — Other Ambulatory Visit: Payer: Self-pay

## 2019-11-07 NOTE — Telephone Encounter (Signed)
CMA also attempted to contact pt-no answer-HIPPA compliant message left on recorder

## 2019-11-07 NOTE — Telephone Encounter (Signed)
Called the patient cell phone twice and went to voicemail.  Left a HIPAA compliant message stating we had an appointment today and I looking forward to connecting with the patient soon to discuss her issues.  Encouraged the patient to call back so that we can set up their appointment.  Kirt Boys, MD Internal Medicine, PGY1 Pager: [phone]  11/07/2019,3:51 PM

## 2019-11-30 ENCOUNTER — Ambulatory Visit: Payer: No Typology Code available for payment source

## 2020-01-02 ENCOUNTER — Encounter: Payer: Self-pay | Admitting: *Deleted

## 2020-02-21 ENCOUNTER — Encounter: Payer: Self-pay | Admitting: Student

## 2020-02-26 ENCOUNTER — Emergency Department (HOSPITAL_COMMUNITY)
Admission: EM | Admit: 2020-02-26 | Discharge: 2020-02-27 | Disposition: A | Discharge status: Home / Self Care | Payer: 59 | Attending: Emergency Medicine | Admitting: Emergency Medicine

## 2020-02-26 ENCOUNTER — Encounter (HOSPITAL_COMMUNITY): Payer: Self-pay

## 2020-02-26 DIAGNOSIS — Z79899 Other long term (current) drug therapy: Secondary | ICD-10-CM | POA: Insufficient documentation

## 2020-02-26 DIAGNOSIS — Z8673 Personal history of transient ischemic attack (TIA), and cerebral infarction without residual deficits: Secondary | ICD-10-CM | POA: Diagnosis not present

## 2020-02-26 DIAGNOSIS — F17210 Nicotine dependence, cigarettes, uncomplicated: Secondary | ICD-10-CM | POA: Insufficient documentation

## 2020-02-26 DIAGNOSIS — M5432 Sciatica, left side: Secondary | ICD-10-CM | POA: Diagnosis not present

## 2020-02-26 DIAGNOSIS — M545 Low back pain: Secondary | ICD-10-CM | POA: Diagnosis present

## 2020-02-26 MED ORDER — KETOROLAC TROMETHAMINE 30 MG/ML IJ SOLN
30.0000 mg | Freq: Once | INTRAMUSCULAR | Status: AC
  Administered 2020-02-26: 30 mg via INTRAMUSCULAR
  Filled 2020-02-26: qty 1

## 2020-02-26 MED ORDER — DEXAMETHASONE SODIUM PHOSPHATE 10 MG/ML IJ SOLN
10.0000 mg | Freq: Once | INTRAMUSCULAR | Status: AC
  Administered 2020-02-26: 10 mg via INTRAMUSCULAR
  Filled 2020-02-26: qty 1

## 2020-02-26 MED ORDER — HYDROCODONE-ACETAMINOPHEN 5-325 MG PO TABS
2.0000 | ORAL_TABLET | Freq: Once | ORAL | Status: AC
  Administered 2020-02-26: 2 via ORAL
  Filled 2020-02-26: qty 2

## 2020-02-26 NOTE — ED Triage Notes (Signed)
Pt arrives to ED w/ c/o 9/10 L sided sciatic nerve pain starting at hip and radiating down leg. Pt has been seeing a chiropractor recently w/ some relief, however, pain has increased over the last week. Pt had a fall today trying to get up to go to the bathroom. Pt denies injury/trauma, no loc. EMS VSS.

## 2020-02-26 NOTE — ED Provider Notes (Signed)
[facility] EMERGENCY DEPARTMENT Provider Note   CSN: [id number] Arrival date & time: 02/26/20  2153     History Chief Complaint  Patient presents with  . Leg Pain    Andrea [name] is a 49 y.o. female.  Patient presents to the emergency department with a chief complaint of left sided leg pain.  She has history of sciatica.  She states that she has been seeing a chiropractor and has been having intermittent success.  She states that over the past couple of days her pain has worsened after having had a fall to the point that she cannot get up and walk because of pain.  She states that she urinated in the bed because she was unable to get up because of pain.  She denies any fevers chills.  She denies any history of cancer.  Denies any history of IV drug use.  She denies any other associated symptoms.  The history is provided by the patient. No language interpreter was used.       Past Medical History:  Diagnosis Date  . Anticardiolipin antibody syndrome (HCC)   . Bipolar affective (HCC)   . Drug-seeking behavior   . Fibromyalgia   . Stroke [facility])    L sided weakness    Patient Active Problem List   Diagnosis Date Noted  . Chronic fatigue 04/04/2019  . Tobacco abuse 04/04/2019  . Trochanteric bursitis of left hip 01/24/2019  . Lymph node enlargement 10/19/2018  . Screening for breast cancer 07/12/2018  . Screening for cervical cancer 04/12/2018  . Need for immunization against influenza 04/12/2018  . Menorrhagia with irregular cycle 01/01/2018  . History of CVA (cerebrovascular accident) 12/01/2017  . Depression with anxiety 12/01/2017  . Lumbar disc disease 12/01/2017  . Fibromyalgia 12/01/2017  . Bipolar disorder (HCC) 12/01/2017    Past Surgical History:  Procedure Laterality Date  . CESAREAN SECTION    . TONSILLECTOMY    . TUBAL LIGATION       OB History    Gravida  3   Para      Term      Preterm      AB  1   Living  2     SAB    1   TAB      Ectopic      Multiple      Live Births              Family History  Adopted: Yes  Problem Relation Age of Onset  . Bipolar disorder Mother   . Fibromyalgia Mother   . Bipolar disorder Maternal Grandmother   . Fibromyalgia Maternal Grandmother   . Arthritis Maternal Grandmother     Social History   Tobacco Use  . Smoking status: Current Every Day Smoker    Packs/day: 1.00    Types: Cigarettes  . Smokeless tobacco: Never Used  . Tobacco comment: .5-1 PPD  Vaping Use  . Vaping Use: Never used  Substance Use Topics  . Alcohol use: Yes    Comment: rarely.  . Drug use: No    Home Medications Prior to Admission medications   Medication Sig Start Date End Date Taking? Authorizing Provider  atorvastatin (LIPITOR) 40 MG tablet Take 1 tablet (40 mg total) by mouth daily. 08/24/18   Arnetha Courser, MD  CYMBALTA 30 MG capsule TAKE 1 Capsule  BY MOUTH TWICE DAILY Patient taking differently: Take 60 mg by mouth daily.  06/03/19  Kirt Boys, MD  hydrocortisone cream 1 % Apply to affected area 2 times daily 09/19/19 09/18/20  Kirt Boys, MD  medroxyPROGESTERone (PROVERA) 5 MG tablet Take 2 tablets (10 mg total) by mouth daily. Take when bleeding. Patient not taking: Reported on 09/19/2019 12/30/18   Conan Bowens, MD  meloxicam [facility]) 15 MG tablet TAKE 1 Tablet BY MOUTH ONCE EVERY DAY 06/01/19   Kirt Boys, MD  pregabalin (LYRICA) 75 MG capsule Take 1 capsule (75 mg total) by mouth 3 (three) times daily. Patient not taking: Reported on 10/27/2019 11/11/18 11/11/19  Arnetha Courser, MD    Allergies    Sulfa antibiotics  Review of Systems   Review of Systems  All other systems reviewed and are negative.   Physical Exam Updated Vital Signs BP (!) 146/74 (BP Location: Left Arm)   Pulse 88   Temp 98.5 F (36.9 C) (Oral)   Resp 20   SpO2 97%   Physical Exam Vitals and nursing note reviewed.  Constitutional:      General: She is not in acute  distress.    Appearance: She is well-developed.  HENT:     Head: Normocephalic and atraumatic.  Eyes:     Conjunctiva/sclera: Conjunctivae normal.  Cardiovascular:     Rate and Rhythm: Normal rate.     Heart sounds: No murmur heard.   Pulmonary:     Effort: Pulmonary effort is normal. No respiratory distress.  Abdominal:     General: There is no distension.  Musculoskeletal:     Cervical back: Neck supple.     Comments: Moves both lower extremities No deformity on exam  Skin:    General: Skin is warm and dry.  Neurological:     Mental Status: She is alert and oriented to person, place, and time.     Comments: Normal lower extremity reflexes No clonus Sensation is 5/5  Psychiatric:        Mood and Affect: Mood normal.        Behavior: Behavior normal.     ED Results / Procedures / Treatments   Labs (all labs ordered are listed, but only abnormal results are displayed) Labs Reviewed - No data to display  EKG None  Radiology No results found.  Procedures Procedures (including critical care time)  Medications Ordered in ED Medications  dexamethasone (DECADRON) injection 10 mg (has no administration in time range)  ketorolac (TORADOL) 30 MG/ML injection 30 mg (has no administration in time range)  HYDROcodone-acetaminophen (NORCO/VICODIN) 5-325 MG per tablet 2 tablet (has no administration in time range)    ED Course  I have reviewed the triage vital signs and the nursing notes.  Pertinent labs & imaging results that were available during my care of the patient were reviewed by me and considered in my medical decision making (see chart for details).    MDM Rules/Calculators/A&P                          Patient with back pain.    No neurological deficits and normal neuro exam.  Patient is ambulatory with walker.  No loss of bowel or bladder control.  Doubt cauda equina.  Denies fever,  doubt epidural abscess or other lesion. Recommend back exercises,  stretching, RICE, and will treat with a short course of prednisone.    Review of prior charts show hx of drug seeking behavior and fibromyalgia.  I do think that she has sciatica and would  benefit from follow-up with specialist.  Encouraged the patient that there could be a need for additional workup and/or imaging such as MRI, if the symptoms do not resolve. Patient advised that if the back pain does not resolve, or radiates, this could progress to more serious conditions and is encouraged to follow-up with PCP or orthopedics within 2 weeks.    Final Clinical Impression(s) / ED Diagnoses Final diagnoses:  Sciatica of left side    Rx / DC Orders ED Discharge Orders         Ordered    predniSONE (DELTASONE) 20 MG tablet  Daily     Discontinue  Reprint     02/27/20 0123           Roxy Horseman, PA-C 02/27/20 0133    Palumbo, April, MD 02/27/20 8115

## 2020-02-27 MED ORDER — PREDNISONE 20 MG PO TABS: 40.0000 mg | ORAL_TABLET | Freq: Every day | ORAL | 0 refills | Status: DC

## 2020-02-27 NOTE — ED Notes (Signed)
Pt unable to find transportation to home at this time. Pt requested to wait in lobby until ride can be obtained safely. Consulting civil engineer and Security made aware. Patient verbalizes understanding of discharge instructions. Opportunity for questioning and answers were provided.

## 2020-02-27 NOTE — ED Notes (Signed)
PT ambulated to bathroom with walker.

## 2020-02-27 NOTE — ED Notes (Signed)
PT attempted to ambulate, PT was unable to stand.

## 2020-02-29 NOTE — Progress Notes (Addendum)
02/29/2020 2:34 pm TOC CM spoke to and she has appt to see Neurosurgeon, Dr Hoyt Koch, on 03/09/2020. She currently has insurance and does not need orange card. ED provider updated.  Isidoro Donning RN CCM, [facility] ED TOC CM [phone]

## 2020-03-01 ENCOUNTER — Encounter: Payer: Self-pay | Admitting: Internal Medicine

## 2020-03-01 ENCOUNTER — Ambulatory Visit (INDEPENDENT_AMBULATORY_CARE_PROVIDER_SITE_OTHER): Payer: 59 | Admitting: Internal Medicine

## 2020-03-01 ENCOUNTER — Ambulatory Visit (HOSPITAL_COMMUNITY)
Admission: RE | Admit: 2020-03-01 | Discharge: 2020-03-01 | Disposition: A | Discharge status: Home / Self Care | Payer: 59 | Source: Ambulatory Visit | Attending: Internal Medicine | Admitting: Internal Medicine

## 2020-03-01 ENCOUNTER — Other Ambulatory Visit: Payer: Self-pay

## 2020-03-01 VITALS — BP 159/93 | HR 74 | Temp 98.2°F | Ht 65.0 in | Wt 243.7 lb

## 2020-03-01 DIAGNOSIS — Y929 Unspecified place or not applicable: Secondary | ICD-10-CM | POA: Diagnosis not present

## 2020-03-01 DIAGNOSIS — W108XXS Fall (on) (from) other stairs and steps, sequela: Secondary | ICD-10-CM

## 2020-03-01 DIAGNOSIS — Z9181 History of falling: Secondary | ICD-10-CM

## 2020-03-01 DIAGNOSIS — Y939 Activity, unspecified: Secondary | ICD-10-CM | POA: Diagnosis not present

## 2020-03-01 DIAGNOSIS — F418 Other specified anxiety disorders: Secondary | ICD-10-CM

## 2020-03-01 DIAGNOSIS — R52 Pain, unspecified: Secondary | ICD-10-CM | POA: Insufficient documentation

## 2020-03-01 DIAGNOSIS — Z8673 Personal history of transient ischemic attack (TIA), and cerebral infarction without residual deficits: Secondary | ICD-10-CM

## 2020-03-01 DIAGNOSIS — Y999 Unspecified external cause status: Secondary | ICD-10-CM | POA: Diagnosis not present

## 2020-03-01 DIAGNOSIS — M797 Fibromyalgia: Secondary | ICD-10-CM | POA: Diagnosis not present

## 2020-03-01 MED ORDER — DULOXETINE HCL 60 MG PO CPEP: 60.0000 mg | ORAL_CAPSULE | Freq: Every day | ORAL | 3 refills | Status: DC

## 2020-03-01 MED ORDER — ACETAMINOPHEN 500 MG PO TABS: 1000.0000 mg | ORAL_TABLET | Freq: Four times a day (QID) | ORAL | 2 refills | Status: AC | PRN

## 2020-03-01 MED ORDER — ATORVASTATIN CALCIUM 40 MG PO TABS: 40.0000 mg | ORAL_TABLET | Freq: Every day | ORAL | 3 refills | Status: DC

## 2020-03-01 NOTE — Progress Notes (Signed)
.  attest

## 2020-03-01 NOTE — Assessment & Plan Note (Addendum)
Andrea [name] is a 49 yo F w/ PMH of bipolar disorder, Fibromyalgia, tobacco use presenting to [facility] for hip pain. She states she was in her usual state of health until 3 months ago when she fell backward onto her hip while feeding her cat. She fell off 2 steps onto hard concrete surface. At the time she had some pain which was self-treated with ibuprofen. She mentions development of progressive left sided pain and numbness on left side of her leg couple weeks after the fall and visiting a chiropractor where she was told she has a misaligned spine and received treatment. She mentions her hip pain and numbness worsened with development of gait abnormality as well. On 7/4, she had a mechanical fall where she tripped and fell again. She mentions catching herself and denies any significant trauma but she could not get herself up and EMS was called. In the ED, she was started on prednisone therapy and outpatient follow up appointment was set-up with neurosurgery for sciatica  A/P Presents for f/u management after fall. Progressively worsening hip pain with radiation. Numbness and radiation of pain suggestive of sciatica. No urinary/bowel incontinence or saddle anesthesia. No imaging available from ED. Will get X-ray to assess for hairline fracture and/or possible displacement after chiropractic procedures.  - DG pelvis - Advised on NSAIDs and Tylenol for pain control

## 2020-03-01 NOTE — Patient Instructions (Addendum)
Thank you for allowing [place] to provide your care today. Today we discussed     I have ordered X-ray of the pelvis for you. I will call if any are abnormal.    Today we made no changes to your medications.  Please add on Tylenol to your medication regimen    Please follow-up after seeing the neurosurgeon.    Should you have any questions or concerns please call the internal medicine clinic at [phone].     Hip Pain The hip is the joint between the upper legs and the lower pelvis. The bones, cartilage, tendons, and muscles of your hip joint support your body and allow you to move around. Hip pain can range from a minor ache to severe pain in one or both of your hips. The pain may be felt on the inside of the hip joint near the groin, or on the outside near the buttocks and upper thigh. You may also have swelling or stiffness in your hip area. Follow these instructions at home: Managing pain, stiffness, and swelling      If directed, put ice on the painful area. To do this: ? Put ice in a plastic bag. ? Place a towel between your skin and the bag. ? Leave the ice on for 20 minutes, 2-3 times a day.  If directed, apply heat to the affected area as often as told by your health care provider. Use the heat source that your health care provider recommends, such as a moist heat pack or a heating pad. ? Place a towel between your skin and the heat source. ? Leave the heat on for 20-30 minutes. ? Remove the heat if your skin turns bright red. This is especially important if you are unable to feel pain, heat, or cold. You may have a greater risk of getting burned. Activity  Do exercises as told by your health care provider.  Avoid activities that cause pain. General instructions   Take over-the-counter and prescription medicines only as told by your health care provider.  Keep a journal of your symptoms. Write down: ? How often you have hip pain. ? The location of your pain. ? What  the pain feels like. ? What makes the pain worse.  Sleep with a pillow between your legs on your most comfortable side.  Keep all follow-up visits as told by your health care provider. This is important. Contact a health care provider if:  You cannot put weight on your leg.  Your pain or swelling continues or gets worse after one week.  It gets harder to walk.  You have a fever. Get help right away if:  You fall.  You have a sudden increase in pain and swelling in your hip.  Your hip is red or swollen or very tender to touch. Summary  Hip pain can range from a minor ache to severe pain in one or both of your hips.  The pain may be felt on the inside of the hip joint near the groin, or on the outside near the buttocks and upper thigh.  Avoid activities that cause pain.  Write down how often you have hip pain, the location of the pain, what makes it worse, and what it feels like. This information is not intended to replace advice given to you by your health care provider. Make sure you discuss any questions you have with your health care provider. Document Revised: 12/27/2018 Document Reviewed: 12/27/2018 Elsevier Patient Education  2020 [employer]  Inc.  

## 2020-03-01 NOTE — Assessment & Plan Note (Signed)
Pt requires refills on medications with associated diagnosis above.  Reviewed disease process and find this medication to be necessary, will not change dose or alter current therapy. 

## 2020-03-01 NOTE — Assessment & Plan Note (Signed)
Mentions running out of duloxetine and requests refills. Continues to endorsee chronic pain but currently hip pain is significantly more severe than her chronic fibromyalgia-related symptoms  - Refill for duloxetine 60mg  sent

## 2020-03-01 NOTE — Progress Notes (Signed)
CC: Hip pain  HPI: [name] is a 49 y.o. with PMH listed below presenting with complaint of hip pain. Please see problem based assessment and plan for further details.  Past Medical History:  Diagnosis Date  . Anticardiolipin antibody syndrome (HCC)   . Bipolar affective (HCC)   . Drug-seeking behavior   . Fibromyalgia   . Stroke [facility])    L sided weakness    Review of Systems: Review of Systems  Constitutional: Negative for chills, fever and malaise/fatigue.  Eyes: Negative for blurred vision.  Respiratory: Negative for shortness of breath.   Cardiovascular: Negative for chest pain, palpitations and leg swelling.  Gastrointestinal: Negative for constipation, diarrhea, nausea and vomiting.  Musculoskeletal: Positive for back pain, falls and joint pain.  Neurological: Negative for dizziness and headaches.     Physical Exam: Vitals:   03/01/20 0907  BP: (!) 159/93  Pulse: 74  Temp: 98.2 F (36.8 C)  TempSrc: Oral  SpO2: 98%  Weight: 243 lb 11.2 oz (110.5 kg)  Height: 5\' 5"  (1.651 m)    Physical Exam Constitutional:      General: She is not in acute distress.    Appearance: She is obese. She is not ill-appearing.  HENT:     Head: Normocephalic and atraumatic.     Mouth/Throat:     Mouth: Mucous membranes are moist.     Pharynx: Oropharynx is clear. No oropharyngeal exudate.  Eyes:     Conjunctiva/sclera: Conjunctivae normal.  Cardiovascular:     Rate and Rhythm: Normal rate and regular rhythm.     Pulses: Normal pulses.     Heart sounds: No murmur heard.   Pulmonary:     Effort: Pulmonary effort is normal.     Breath sounds: Normal breath sounds. No wheezing or rales.  Abdominal:     General: Abdomen is flat. Bowel sounds are normal. There is no distension.     Palpations: Abdomen is soft.     Tenderness: There is no abdominal tenderness.  Musculoskeletal:        General: Tenderness (L hip tenderness to deep palpation) present. No swelling (No  edema, erythema or wamrth over L hip).     Comments: Active and passive abduction limited by pain. Hip flexion, extension intact  Skin:    General: Skin is warm and dry.  Neurological:     Mental Status: She is alert and oriented to person, place, and time.     Sensory: Sensory deficit (Numbness to fine touch on S1/S2 distribution on LLE) present.     Motor: Weakness (3/5 strength on dorsiflexion LLE, but endorsing pain on movement) present.     Deep Tendon Reflexes: Reflexes normal (Normal reflexes).  Psychiatric:        Behavior: Behavior normal.     Comments: Labile mood      Assessment & Plan:   Fibromyalgia Mentions running out of duloxetine and requests refills. Continues to endorsee chronic pain but currently hip pain is significantly more severe than her chronic fibromyalgia-related symptoms  - Refill for duloxetine 60mg  sent  Fall (on) (from) other stairs and steps, sequela Andrea [name] is a 54 yo F w/ PMH of bipolar disorder, Fibromyalgia, tobacco use presenting to [facility] for hip pain. She states she was in her usual state of health until 3 months ago when she fell backward onto her hip while feeding her cat. She fell off 2 steps onto hard concrete surface. At the time she had some pain which  was self-treated with ibuprofen. She mentions development of progressive left sided pain and numbness on left side of her leg couple weeks after the fall and visiting a chiropractor where she was told she has a misaligned spine and received treatment. She mentions her hip pain and numbness worsened with development of gait abnormality as well. On 7/4, she had a mechanical fall where she tripped and fell again. She mentions catching herself and denies any significant trauma but she could not get herself up and EMS was called. In the ED, she was started on prednisone therapy and outpatient follow up appointment was set-up with neurosurgery for sciatica  A/P Presents for f/u management after fall.  Progressively worsening hip pain with radiation. Numbness and radiation of pain suggestive of sciatica. No urinary/bowel incontinence or saddle anesthesia. No imaging available from ED. Will get X-ray to assess for hairline fracture and/or possible displacement after chiropractic procedures.  - DG pelvis - Advised on NSAIDs and Tylenol for pain control  History of CVA (cerebrovascular accident) Pt requires refills on medications with associated diagnosis above.  Reviewed disease process and find this medication to be necessary, will not change dose or alter current therapy.    Patient discussed with Dr. Mayford Knife  -Judeth Cornfield, PGY3 [facility] Internal Medicine Pager: [phone]

## 2020-03-02 ENCOUNTER — Telehealth: Payer: Self-pay | Admitting: Student

## 2020-03-02 NOTE — Telephone Encounter (Signed)
Spoke with Andrea [name] and informed her of her negative X-ray results. Advised to continue with supportive care. Andrea [name] requests medical note for jury duty on the 12th. Advised to pick up at front desk.

## 2020-03-02 NOTE — Telephone Encounter (Signed)
Pt is requesting a dr note for yesterday visit; pls contact [phone]

## 2020-03-14 ENCOUNTER — Other Ambulatory Visit: Payer: Self-pay | Admitting: Neurosurgery

## 2020-03-14 DIAGNOSIS — M5416 Radiculopathy, lumbar region: Secondary | ICD-10-CM

## 2020-03-19 ENCOUNTER — Ambulatory Visit: Payer: 59

## 2020-04-09 ENCOUNTER — Ambulatory Visit
Admission: RE | Admit: 2020-04-09 | Discharge: 2020-04-09 | Disposition: A | Discharge status: Home / Self Care | Payer: 59 | Source: Ambulatory Visit | Attending: Neurosurgery | Admitting: Neurosurgery

## 2020-04-09 ENCOUNTER — Other Ambulatory Visit: Payer: Self-pay

## 2020-04-09 DIAGNOSIS — M5416 Radiculopathy, lumbar region: Secondary | ICD-10-CM

## 2020-04-23 ENCOUNTER — Other Ambulatory Visit: Payer: Self-pay | Admitting: Neurosurgery

## 2020-04-23 DIAGNOSIS — M5416 Radiculopathy, lumbar region: Secondary | ICD-10-CM

## 2020-04-27 ENCOUNTER — Ambulatory Visit
Admission: RE | Admit: 2020-04-27 | Discharge: 2020-04-27 | Disposition: A | Discharge status: Home / Self Care | Payer: 59 | Source: Ambulatory Visit | Attending: Neurosurgery | Admitting: Neurosurgery

## 2020-04-27 ENCOUNTER — Other Ambulatory Visit: Payer: Self-pay

## 2020-04-27 DIAGNOSIS — M5416 Radiculopathy, lumbar region: Secondary | ICD-10-CM

## 2020-04-27 MED ORDER — IOPAMIDOL (ISOVUE-M 200) INJECTION 41%
1.0000 mL | Freq: Once | INTRAMUSCULAR | Status: AC
  Administered 2020-04-27: 1 mL via EPIDURAL

## 2020-04-27 MED ORDER — METHYLPREDNISOLONE ACETATE 40 MG/ML INJ SUSP (RADIOLOG
120.0000 mg | Freq: Once | INTRAMUSCULAR | Status: AC
  Administered 2020-04-27: 120 mg via EPIDURAL

## 2020-04-27 NOTE — Discharge Instructions (Signed)

## 2020-05-22 ENCOUNTER — Other Ambulatory Visit: Payer: Self-pay | Admitting: Neurosurgery

## 2020-05-22 DIAGNOSIS — M5416 Radiculopathy, lumbar region: Secondary | ICD-10-CM

## 2020-05-30 ENCOUNTER — Inpatient Hospital Stay: Admission: RE | Admit: 2020-05-30 | Payer: 59 | Source: Ambulatory Visit

## 2020-06-01 ENCOUNTER — Other Ambulatory Visit: Payer: Self-pay

## 2020-06-01 DIAGNOSIS — F418 Other specified anxiety disorders: Secondary | ICD-10-CM

## 2020-06-01 MED ORDER — DULOXETINE HCL 60 MG PO CPEP: 60.0000 mg | ORAL_CAPSULE | Freq: Every day | ORAL | 3 refills | Status: DC

## 2020-06-01 NOTE — Telephone Encounter (Signed)
DULoxetine (CYMBALTA) 60 MG capsule, REFILL REQUEST @ [facility] ON [address].

## 2020-06-06 ENCOUNTER — Inpatient Hospital Stay: Admission: RE | Admit: 2020-06-06 | Payer: 59 | Source: Ambulatory Visit

## 2020-06-15 ENCOUNTER — Other Ambulatory Visit: Payer: Self-pay | Admitting: Neurosurgery

## 2020-06-20 ENCOUNTER — Other Ambulatory Visit: Payer: Self-pay | Admitting: Neurosurgery

## 2020-06-25 ENCOUNTER — Encounter (HOSPITAL_COMMUNITY)
Admission: RE | Admit: 2020-06-25 | Discharge: 2020-06-25 | Disposition: A | Discharge status: Home / Self Care | Payer: 59 | Source: Ambulatory Visit | Attending: Neurosurgery | Admitting: Neurosurgery

## 2020-06-25 ENCOUNTER — Encounter (HOSPITAL_COMMUNITY): Payer: Self-pay

## 2020-06-25 ENCOUNTER — Other Ambulatory Visit: Payer: Self-pay

## 2020-06-25 ENCOUNTER — Other Ambulatory Visit (HOSPITAL_COMMUNITY)
Admission: RE | Admit: 2020-06-25 | Discharge: 2020-06-25 | Disposition: A | Discharge status: Home / Self Care | Payer: 59 | Source: Ambulatory Visit | Attending: Neurosurgery | Admitting: Neurosurgery

## 2020-06-25 DIAGNOSIS — Z01818 Encounter for other preprocedural examination: Secondary | ICD-10-CM | POA: Insufficient documentation

## 2020-06-25 DIAGNOSIS — Z20822 Contact with and (suspected) exposure to covid-19: Secondary | ICD-10-CM | POA: Insufficient documentation

## 2020-06-25 HISTORY — DX: Sleep apnea, unspecified: G47.30

## 2020-06-25 LAB — CBC
HCT: 42.5 % (ref 36.0–46.0)
Hemoglobin: 13.7 g/dL (ref 12.0–15.0)
MCH: 30.9 pg (ref 26.0–34.0)
MCHC: 32.2 g/dL (ref 30.0–36.0)
MCV: 95.7 fL (ref 80.0–100.0)
Platelets: 246 10*3/uL (ref 150–400)
RBC: 4.44 MIL/uL (ref 3.87–5.11)
RDW: 12.9 % (ref 11.5–15.5)
WBC: 9.8 10*3/uL (ref 4.0–10.5)
nRBC: 0 % (ref 0.0–0.2)

## 2020-06-25 LAB — BASIC METABOLIC PANEL
Anion gap: 9 (ref 5–15)
BUN: 26 mg/dL — ABNORMAL HIGH (ref 6–20)
CO2: 21 mmol/L — ABNORMAL LOW (ref 22–32)
Calcium: 8.8 mg/dL — ABNORMAL LOW (ref 8.9–10.3)
Chloride: 106 mmol/L (ref 98–111)
Creatinine, Ser: 0.84 mg/dL (ref 0.44–1.00)
GFR, Estimated: >60 mL/min (ref >60)
Glucose, Bld: 144 mg/dL — ABNORMAL HIGH (ref 70–99)
Potassium: 3.5 mmol/L (ref 3.5–5.1)
Sodium: 136 mmol/L (ref 135–145)

## 2020-06-25 LAB — SURGICAL PCR SCREEN
MRSA, PCR: NEGATIVE
Staphylococcus aureus: NEGATIVE

## 2020-06-25 NOTE — Progress Notes (Addendum)
PCP:  Doran Stabler, MD Cardiologist:  Denies  EKG:  N/A CXR: N/A ECHO:  Denies Stress Test:  Denies Cardiac Cath:  Denies  Blood thinners:  NO  Covid test 06/25/20  OSA:  Yes CPAP:  No  Preop clearance on chart.  Patient denies shortness of breath, fever, cough, and chest pain at PAT appointment.  Patient verbalized understanding of instructions provided today at the PAT appointment.  Patient asked to review instructions at home and day of surgery.

## 2020-06-25 NOTE — Progress Notes (Signed)
No Pharmacies Listed     Your procedure is scheduled on 06/27/20.  Report to [facility] Main Entrance "A" at 6:30 A.M., and check in at the Admitting office.  Call this number if you have problems the morning of surgery:  [phone]  Call [phone] if you have any questions prior to your surgery date Monday-Friday 8am-4pm    Remember:  Do not eat or drink after midnight the night before your surgery     Take these medicines the morning of surgery with A SIP OF WATER: atorvastatin (LIPITOR)  DULoxetine (CYMBALTA) gabapentin (NEURONTIN)   As of today, STOP taking any Aspirin (unless otherwise instructed by your surgeon) Aleve, Naproxen, Ibuprofen, Motrin, Advil, Goody's, BC's, all herbal medications, fish oil, and all vitamins.                      Do not wear jewelry, make up, or nail polish            Do not wear lotions, powders, perfumes or deodorant.            Do not shave 48 hours prior to surgery.              Do not bring valuables to the hospital.            [facility] is not responsible for any belongings or valuables.  Do NOT Smoke (Tobacco/Vaping) or drink Alcohol 24 hours prior to your procedure If you use a CPAP at night, you may bring all equipment for your overnight stay.   Contacts, glasses, dentures or bridgework may not be worn into surgery.      For patients admitted to the hospital, discharge time will be determined by your treatment team.   Patients discharged the day of surgery will not be allowed to drive home, and someone needs to stay with them for 24 hours.    Special instructions:   [facility]- Preparing For Surgery  Before surgery, you can play an important role. Because skin is not sterile, your skin needs to be as free of germs as possible. You can reduce the number of germs on your skin by washing with CHG (chlorahexidine gluconate) Soap before surgery.  CHG is an antiseptic cleaner which kills germs and bonds with the skin to continue  killing germs even after washing.    Oral Hygiene is also important to reduce your risk of infection.  Remember - BRUSH YOUR TEETH THE MORNING OF SURGERY WITH YOUR REGULAR TOOTHPASTE  Please do not use if you have an allergy to CHG or antibacterial soaps. If your skin becomes reddened/irritated stop using the CHG.  Do not shave (including legs and underarms) for at least 48 hours prior to first CHG shower. It is OK to shave your face.  Please follow these instructions carefully.   1. Shower the NIGHT BEFORE SURGERY and the MORNING OF SURGERY with CHG Soap.   2. If you chose to wash your hair, wash your hair first as usual with your normal shampoo.  3. After you shampoo, rinse your hair and body thoroughly to remove the shampoo.  4. Use CHG as you would any other liquid soap. You can apply CHG directly to the skin and wash gently with a scrungie or a clean washcloth.   5. Apply the CHG Soap to your body ONLY FROM THE NECK DOWN.  Do not use on open wounds or open sores. Avoid contact with your eyes, ears, mouth  and genitals (private parts). Wash Face and genitals (private parts)  with your normal soap.   6. Wash thoroughly, paying special attention to the area where your surgery will be performed.  7. Thoroughly rinse your body with warm water from the neck down.  8. DO NOT shower/wash with your normal soap after using and rinsing off the CHG Soap.  9. Pat yourself dry with a CLEAN TOWEL.  10. Wear CLEAN PAJAMAS to bed the night before surgery  11. Place CLEAN SHEETS on your bed the night of your first shower and DO NOT SLEEP WITH PETS.   Day of Surgery: Wear Clean/Comfortable clothing the morning of surgery Do not apply any deodorants/lotions.   Remember to brush your teeth WITH YOUR REGULAR TOOTHPASTE.   Please read over the following fact sheets that you were given.

## 2020-06-26 LAB — SARS CORONAVIRUS 2 (TAT 6-24 HRS): SARS Coronavirus 2: NEGATIVE

## 2020-06-26 NOTE — Anesthesia Preprocedure Evaluation (Addendum)
Anesthesia Evaluation  Patient identified by MRN, date of birth, ID band Patient awake    Reviewed: Allergy & Precautions, NPO status , Patient's Chart, lab work & pertinent test results  Airway Mallampati: III  TM Distance: >3 FB Neck ROM: Full    Dental  (+) Loose, Missing   Pulmonary sleep apnea , Current Smoker and Patient abstained from smoking.,    Pulmonary exam normal breath sounds clear to auscultation       Cardiovascular negative cardio ROS Normal cardiovascular exam Rhythm:Regular Rate:Normal     Neuro/Psych PSYCHIATRIC DISORDERS Anxiety Depression Bipolar Disorder Left sided weakness  Neuromuscular disease CVA, Residual Symptoms    GI/Hepatic negative GI ROS, Neg liver ROS,   Endo/Other  Morbid obesity  Renal/GU negative Renal ROS     Musculoskeletal  (+) Fibromyalgia -  Abdominal (+) + obese,   Peds  Hematology HLD   Anesthesia Other Findings LUMBAR BACK PAIN WITH RADICULOPATHY AFFECTING LEFT LOWER EXTREMITY  Reproductive/Obstetrics                           Anesthesia Physical Anesthesia Plan  ASA: III  Anesthesia Plan: General   Post-op Pain Management:    Induction: Intravenous  PONV Risk Score and Plan: 3 and Midazolam, Dexamethasone, Ondansetron and Treatment may vary due to age or medical condition  Airway Management Planned: Oral ETT  Additional Equipment:   Intra-op Plan:   Post-operative Plan: Extubation in OR  Informed Consent: I have reviewed the patients History and Physical, chart, labs and discussed the procedure including the risks, benefits and alternatives for the proposed anesthesia with the patient or authorized representative who has indicated his/her understanding and acceptance.     Dental advisory given  Plan Discussed with: CRNA  Anesthesia Plan Comments:        Anesthesia Quick Evaluation

## 2020-06-27 ENCOUNTER — Other Ambulatory Visit: Payer: Self-pay

## 2020-06-27 ENCOUNTER — Ambulatory Visit (HOSPITAL_COMMUNITY): Payer: 59

## 2020-06-27 ENCOUNTER — Ambulatory Visit (HOSPITAL_COMMUNITY)
Admission: RE | Admit: 2020-06-27 | Discharge: 2020-06-27 | Disposition: A | Discharge status: Home / Self Care | Payer: 59 | Attending: Neurosurgery | Admitting: Neurosurgery

## 2020-06-27 ENCOUNTER — Encounter (HOSPITAL_COMMUNITY): Payer: Self-pay

## 2020-06-27 ENCOUNTER — Ambulatory Visit (HOSPITAL_COMMUNITY): Payer: 59 | Admitting: Anesthesiology

## 2020-06-27 ENCOUNTER — Ambulatory Visit (HOSPITAL_COMMUNITY): Payer: 59 | Admitting: Vascular Surgery

## 2020-06-27 ENCOUNTER — Encounter (HOSPITAL_COMMUNITY)
Admission: RE | Disposition: A | Discharge status: Home / Self Care | Payer: Self-pay | Source: Home / Self Care | Attending: Neurosurgery

## 2020-06-27 DIAGNOSIS — F17210 Nicotine dependence, cigarettes, uncomplicated: Secondary | ICD-10-CM | POA: Diagnosis not present

## 2020-06-27 DIAGNOSIS — Z882 Allergy status to sulfonamides status: Secondary | ICD-10-CM | POA: Insufficient documentation

## 2020-06-27 DIAGNOSIS — Z419 Encounter for procedure for purposes other than remedying health state, unspecified: Secondary | ICD-10-CM

## 2020-06-27 DIAGNOSIS — Z6841 Body Mass Index (BMI) 40.0 and over, adult: Secondary | ICD-10-CM | POA: Insufficient documentation

## 2020-06-27 DIAGNOSIS — M5117 Intervertebral disc disorders with radiculopathy, lumbosacral region: Secondary | ICD-10-CM | POA: Diagnosis not present

## 2020-06-27 HISTORY — PX: LUMBAR LAMINECTOMY/ DECOMPRESSION WITH MET-RX: SHX5959

## 2020-06-27 LAB — HCG, SERUM, QUALITATIVE: Preg, Serum: NEGATIVE

## 2020-06-27 SURGERY — LUMBAR LAMINECTOMY/ DECOMPRESSION WITH MET-RX
Anesthesia: General | Site: Spine Lumbar | Laterality: Left

## 2020-06-27 MED ORDER — METHYLPREDNISOLONE ACETATE 80 MG/ML IJ SUSP
INTRAMUSCULAR | Status: AC
  Filled 2020-06-27: qty 1

## 2020-06-27 MED ORDER — OXYCODONE HCL 5 MG/5ML PO SOLN: 5.0000 mg | Freq: Once | ORAL | Status: AC | PRN

## 2020-06-27 MED ORDER — LIDOCAINE-EPINEPHRINE 1 %-1:100000 IJ SOLN
INTRAMUSCULAR | Status: AC
  Filled 2020-06-27: qty 1

## 2020-06-27 MED ORDER — CHLORHEXIDINE GLUCONATE CLOTH 2 % EX PADS: 6.0000 | MEDICATED_PAD | Freq: Once | CUTANEOUS | Status: DC

## 2020-06-27 MED ORDER — PROPOFOL 10 MG/ML IV BOLUS
INTRAVENOUS | Status: AC
  Filled 2020-06-27: qty 20

## 2020-06-27 MED ORDER — DOCUSATE SODIUM 100 MG PO CAPS: 100.0000 mg | ORAL_CAPSULE | Freq: Two times a day (BID) | ORAL | 2 refills | Status: AC

## 2020-06-27 MED ORDER — ORAL CARE MOUTH RINSE: 15.0000 mL | Freq: Once | OROMUCOSAL | Status: AC

## 2020-06-27 MED ORDER — LIDOCAINE 2% (20 MG/ML) 5 ML SYRINGE
INTRAMUSCULAR | Status: AC
  Filled 2020-06-27: qty 5

## 2020-06-27 MED ORDER — MIDAZOLAM HCL 2 MG/2ML IJ SOLN
INTRAMUSCULAR | Status: AC
  Filled 2020-06-27: qty 2

## 2020-06-27 MED ORDER — OXYCODONE HCL 5 MG PO TABS
5.0000 mg | ORAL_TABLET | Freq: Once | ORAL | Status: AC | PRN
  Administered 2020-06-27: 5 mg via ORAL

## 2020-06-27 MED ORDER — FENTANYL CITRATE (PF) 250 MCG/5ML IJ SOLN
INTRAMUSCULAR | Status: AC
  Filled 2020-06-27: qty 5

## 2020-06-27 MED ORDER — METHYLPREDNISOLONE ACETATE 80 MG/ML IJ SUSP
INTRAMUSCULAR | Status: DC | PRN
  Administered 2020-06-27: 80 mg

## 2020-06-27 MED ORDER — BUPIVACAINE HCL (PF) 0.5 % IJ SOLN
INTRAMUSCULAR | Status: AC
  Filled 2020-06-27: qty 30

## 2020-06-27 MED ORDER — HYDROMORPHONE HCL 1 MG/ML IJ SOLN
0.2500 mg | INTRAMUSCULAR | Status: DC | PRN
  Administered 2020-06-27 (×2): 0.5 mg via INTRAVENOUS

## 2020-06-27 MED ORDER — MIDAZOLAM HCL 5 MG/5ML IJ SOLN
INTRAMUSCULAR | Status: DC | PRN
  Administered 2020-06-27: 2 mg via INTRAVENOUS

## 2020-06-27 MED ORDER — ONDANSETRON HCL 4 MG/2ML IJ SOLN
INTRAMUSCULAR | Status: DC | PRN
  Administered 2020-06-27: 4 mg via INTRAVENOUS

## 2020-06-27 MED ORDER — LIDOCAINE-EPINEPHRINE 1 %-1:100000 IJ SOLN
INTRAMUSCULAR | Status: DC | PRN
  Administered 2020-06-27: 8 mL

## 2020-06-27 MED ORDER — DEXMEDETOMIDINE (PRECEDEX) IN NS 20 MCG/5ML (4 MCG/ML) IV SYRINGE
PREFILLED_SYRINGE | INTRAVENOUS | Status: AC
  Filled 2020-06-27: qty 5

## 2020-06-27 MED ORDER — HEMOSTATIC AGENTS (NO CHARGE) OPTIME
TOPICAL | Status: DC | PRN
  Administered 2020-06-27: 1 via TOPICAL

## 2020-06-27 MED ORDER — THROMBIN 5000 UNITS EX SOLR
OROMUCOSAL | Status: DC | PRN
  Administered 2020-06-27: 5 mL via TOPICAL

## 2020-06-27 MED ORDER — CHLORHEXIDINE GLUCONATE 0.12 % MT SOLN
15.0000 mL | Freq: Once | OROMUCOSAL | Status: AC
  Administered 2020-06-27: 15 mL via OROMUCOSAL
  Filled 2020-06-27: qty 15

## 2020-06-27 MED ORDER — PROPOFOL 10 MG/ML IV BOLUS
INTRAVENOUS | Status: DC | PRN
  Administered 2020-06-27: 200 mg via INTRAVENOUS

## 2020-06-27 MED ORDER — 0.9 % SODIUM CHLORIDE (POUR BTL) OPTIME
TOPICAL | Status: DC | PRN
  Administered 2020-06-27: 1000 mL

## 2020-06-27 MED ORDER — OXYCODONE-ACETAMINOPHEN 5-325 MG PO TABS
1.0000 | ORAL_TABLET | ORAL | 0 refills | Status: AC | PRN
Start: 2020-06-27 — End: 2020-09-25

## 2020-06-27 MED ORDER — BUPIVACAINE HCL 0.5 % IJ SOLN
INTRAMUSCULAR | Status: DC | PRN
  Administered 2020-06-27: 28 mL

## 2020-06-27 MED ORDER — KETOROLAC TROMETHAMINE 30 MG/ML IJ SOLN
INTRAMUSCULAR | Status: AC
  Filled 2020-06-27: qty 1

## 2020-06-27 MED ORDER — ROCURONIUM BROMIDE 10 MG/ML (PF) SYRINGE
PREFILLED_SYRINGE | INTRAVENOUS | Status: DC | PRN
  Administered 2020-06-27: 100 mg via INTRAVENOUS

## 2020-06-27 MED ORDER — LACTATED RINGERS IV SOLN: INTRAVENOUS | Status: DC

## 2020-06-27 MED ORDER — KETOROLAC TROMETHAMINE 30 MG/ML IJ SOLN
30.0000 mg | Freq: Once | INTRAMUSCULAR | Status: AC | PRN
  Administered 2020-06-27: 30 mg via INTRAVENOUS

## 2020-06-27 MED ORDER — CYCLOBENZAPRINE HCL 10 MG PO TABS: 10.0000 mg | ORAL_TABLET | Freq: Three times a day (TID) | ORAL | 0 refills | Status: DC | PRN

## 2020-06-27 MED ORDER — LIDOCAINE 2% (20 MG/ML) 5 ML SYRINGE
INTRAMUSCULAR | Status: DC | PRN
  Administered 2020-06-27: 60 mg via INTRAVENOUS

## 2020-06-27 MED ORDER — DEXAMETHASONE SODIUM PHOSPHATE 10 MG/ML IJ SOLN
INTRAMUSCULAR | Status: DC | PRN
  Administered 2020-06-27: 10 mg via INTRAVENOUS

## 2020-06-27 MED ORDER — OXYCODONE HCL 5 MG PO TABS
ORAL_TABLET | ORAL | Status: AC
  Filled 2020-06-27: qty 1

## 2020-06-27 MED ORDER — ONDANSETRON HCL 4 MG/2ML IJ SOLN
INTRAMUSCULAR | Status: AC
  Filled 2020-06-27: qty 2

## 2020-06-27 MED ORDER — HYDROMORPHONE HCL 1 MG/ML IJ SOLN
INTRAMUSCULAR | Status: AC
  Filled 2020-06-27: qty 1

## 2020-06-27 MED ORDER — CEFAZOLIN SODIUM-DEXTROSE 2-4 GM/100ML-% IV SOLN
2.0000 g | INTRAVENOUS | Status: AC
  Administered 2020-06-27: 2 g via INTRAVENOUS
  Filled 2020-06-27: qty 100

## 2020-06-27 MED ORDER — DEXAMETHASONE SODIUM PHOSPHATE 10 MG/ML IJ SOLN
INTRAMUSCULAR | Status: AC
  Filled 2020-06-27: qty 1

## 2020-06-27 MED ORDER — ROCURONIUM BROMIDE 10 MG/ML (PF) SYRINGE
PREFILLED_SYRINGE | INTRAVENOUS | Status: AC
  Filled 2020-06-27: qty 10

## 2020-06-27 MED ORDER — THROMBIN 5000 UNITS EX SOLR
CUTANEOUS | Status: DC | PRN
  Administered 2020-06-27 (×2): 5000 [IU] via TOPICAL

## 2020-06-27 MED ORDER — THROMBIN 5000 UNITS EX SOLR
CUTANEOUS | Status: AC
  Filled 2020-06-27: qty 15000

## 2020-06-27 MED ORDER — FENTANYL CITRATE (PF) 250 MCG/5ML IJ SOLN
INTRAMUSCULAR | Status: DC | PRN
  Administered 2020-06-27: 150 ug via INTRAVENOUS

## 2020-06-27 MED ORDER — SUGAMMADEX SODIUM 200 MG/2ML IV SOLN
INTRAVENOUS | Status: DC | PRN
  Administered 2020-06-27 (×2): 100 mg via INTRAVENOUS

## 2020-06-27 MED ORDER — ACETAMINOPHEN 500 MG PO TABS
1000.0000 mg | ORAL_TABLET | Freq: Once | ORAL | Status: AC
  Administered 2020-06-27: 1000 mg via ORAL
  Filled 2020-06-27: qty 2

## 2020-06-27 MED ORDER — PROMETHAZINE HCL 25 MG/ML IJ SOLN: 6.2500 mg | INTRAMUSCULAR | Status: DC | PRN

## 2020-06-27 SURGICAL SUPPLY — 64 items
ADH SKN CLS APL DERMABOND .7 (GAUZE/BANDAGES/DRESSINGS) ×1
APL SKNCLS STERI-STRIP NONHPOA (GAUZE/BANDAGES/DRESSINGS) ×1
BAND INSRT 18 STRL LF DISP RB (MISCELLANEOUS) ×2
BAND RUBBER #18 3X1/16 STRL (MISCELLANEOUS) ×6 IMPLANT
BENZOIN TINCTURE PRP APPL 2/3 (GAUZE/BANDAGES/DRESSINGS) ×3 IMPLANT
BLADE CLIPPER SURG (BLADE) IMPLANT
BLADE SURG 11 STRL SS (BLADE) ×3 IMPLANT
BUR 2.5 MTCH HD 16 (BUR) ×2 IMPLANT
BUR 2.5MM MTCH HD 16CM (BUR) ×1
BUR MATCHSTICK NEURO 3.0 LAGG (BURR) IMPLANT
CANISTER SUCT 3000ML PPV (MISCELLANEOUS) ×3 IMPLANT
CLOSURE WOUND 1/2 X4 (GAUZE/BANDAGES/DRESSINGS) ×1
COVER WAND RF STERILE (DRAPES) ×1 IMPLANT
DECANTER SPIKE VIAL GLASS SM (MISCELLANEOUS) ×1 IMPLANT
DERMABOND ADVANCED (GAUZE/BANDAGES/DRESSINGS) ×2
DERMABOND ADVANCED .7 DNX12 (GAUZE/BANDAGES/DRESSINGS) IMPLANT
DRAPE C-ARM 42X72 X-RAY (DRAPES) ×6 IMPLANT
DRAPE LAPAROTOMY 100X72X124 (DRAPES) ×3 IMPLANT
DRAPE MICROSCOPE LEICA (MISCELLANEOUS) ×3 IMPLANT
DRAPE SURG 17X23 STRL (DRAPES) ×3 IMPLANT
DRSG MEPILEX BORDER 4X4 (GAUZE/BANDAGES/DRESSINGS) ×1 IMPLANT
DRSG OPSITE POSTOP 3X4 (GAUZE/BANDAGES/DRESSINGS) ×2 IMPLANT
DURAPREP 26ML APPLICATOR (WOUND CARE) ×3 IMPLANT
ELECT BLADE INSULATED 4IN (ELECTROSURGICAL) ×3
ELECT COATED BLADE 2.86 ST (ELECTRODE) ×3 IMPLANT
ELECT REM PT RETURN 9FT ADLT (ELECTROSURGICAL) ×3
ELECTRODE BLADE INSULATED 4IN (ELECTROSURGICAL) IMPLANT
ELECTRODE REM PT RTRN 9FT ADLT (ELECTROSURGICAL) ×1 IMPLANT
GAUZE 4X4 16PLY RFD (DISPOSABLE) IMPLANT
GAUZE SPONGE 4X4 12PLY STRL (GAUZE/BANDAGES/DRESSINGS) IMPLANT
GLOVE BIOGEL PI IND STRL 6.5 (GLOVE) IMPLANT
GLOVE BIOGEL PI IND STRL 7.5 (GLOVE) ×2 IMPLANT
GLOVE BIOGEL PI INDICATOR 6.5 (GLOVE) ×2
GLOVE BIOGEL PI INDICATOR 7.5 (GLOVE) ×4
GLOVE ECLIPSE 6.5 STRL STRAW (GLOVE) ×2 IMPLANT
GLOVE ECLIPSE 7.5 STRL STRAW (GLOVE) ×3 IMPLANT
GLOVE EXAM NITRILE XL STR (GLOVE) IMPLANT
GLOVE SURG SS PI 6.0 STRL IVOR (GLOVE) ×9 IMPLANT
GOWN STRL REUS W/ TWL LRG LVL3 (GOWN DISPOSABLE) ×2 IMPLANT
GOWN STRL REUS W/ TWL XL LVL3 (GOWN DISPOSABLE) ×1 IMPLANT
GOWN STRL REUS W/TWL 2XL LVL3 (GOWN DISPOSABLE) IMPLANT
GOWN STRL REUS W/TWL LRG LVL3 (GOWN DISPOSABLE) ×9
GOWN STRL REUS W/TWL XL LVL3 (GOWN DISPOSABLE) ×3
HEMOSTAT POWDER KIT SURGIFOAM (HEMOSTASIS) ×3 IMPLANT
KIT BASIN OR (CUSTOM PROCEDURE TRAY) ×3 IMPLANT
KIT TURNOVER KIT B (KITS) ×3 IMPLANT
NDL HYPO 25X1 1.5 SAFETY (NEEDLE) ×1 IMPLANT
NEEDLE HYPO 18GX1.5 BLUNT FILL (NEEDLE) ×3 IMPLANT
NEEDLE HYPO 25X1 1.5 SAFETY (NEEDLE) ×6 IMPLANT
NEEDLE SPNL 18GX3.5 QUINCKE PK (NEEDLE) ×3 IMPLANT
NS IRRIG 1000ML POUR BTL (IV SOLUTION) ×3 IMPLANT
PACK LAMINECTOMY NEURO (CUSTOM PROCEDURE TRAY) ×3 IMPLANT
PAD ARMBOARD 7.5X6 YLW CONV (MISCELLANEOUS) ×15 IMPLANT
SPONGE LAP 4X18 RFD (DISPOSABLE) IMPLANT
SPONGE SURGIFOAM ABS GEL SZ50 (HEMOSTASIS) ×3 IMPLANT
STRIP CLOSURE SKIN 1/2X4 (GAUZE/BANDAGES/DRESSINGS) ×1 IMPLANT
SUT MON AB 3-0 SH 27 (SUTURE) ×3
SUT MON AB 3-0 SH27 (SUTURE) ×1 IMPLANT
SUT VIC AB 3-0 SH 8-18 (SUTURE) ×3 IMPLANT
SYR 3ML LL SCALE MARK (SYRINGE) ×2 IMPLANT
SYR CONTROL 10ML LL (SYRINGE) ×3 IMPLANT
TOWEL GREEN STERILE (TOWEL DISPOSABLE) ×3 IMPLANT
TOWEL GREEN STERILE FF (TOWEL DISPOSABLE) ×3 IMPLANT
WATER STERILE IRR 1000ML POUR (IV SOLUTION) ×3 IMPLANT

## 2020-06-27 NOTE — Anesthesia Procedure Notes (Signed)
Procedure Name: Intubation Date/Time: 06/27/2020 9:04 AM Performed by: Trinna Post., CRNA Pre-anesthesia Checklist: Patient identified, Emergency Drugs available, Suction available, Patient being monitored and Timeout performed Patient Re-evaluated:Patient Re-evaluated prior to induction Oxygen Delivery Method: Circle system utilized Preoxygenation: Pre-oxygenation with 100% oxygen Induction Type: IV induction Ventilation: Mask ventilation without difficulty Laryngoscope Size: Mac and 3 Grade View: Grade I Tube type: Oral Tube size: 7.0 mm Number of attempts: 1 Airway Equipment and Method: Stylet Placement Confirmation: ETT inserted through vocal cords under direct vision,  positive ETCO2 and breath sounds checked- equal and bilateral Secured at: 21 cm Tube secured with: Tape Dental Injury: Teeth and Oropharynx as per pre-operative assessment

## 2020-06-27 NOTE — Op Note (Signed)
PREOP DIAGNOSIS: Herniated nucleus pulposus at Left L5-S1 with radiculopathy  POSTOP DIAGNOSIS: Herniated nucleus pulposus at Left L5-S1 with radiculopathy  PROCEDURE: 1. Left L5-S1 laminotomy, medial facetectomy for excision of herniated disc using tubular retractor system 2.  Use of microscope for microdissection  SURGEON: Dr. Hoyt Koch, MD  ASSISTANT: Coletta Memos, MD.  Please note, there were no qualified trainees available to assist with the procedure.  An assistant was required for aid in retraction of the neural elements.   ANESTHESIA: General Endotracheal  EBL: 25 ml  SPECIMENS: None  DRAINS: None  COMPLICATIONS: none  CONDITION: Stable to PCAU  HISTORY: Andrea [name] is a 49 y.o. female who initially presented to the outpatient clinic with lumbar radiculopathy. MRI showed a left L5-S1 disc herniation.  The patient had failed nonsurgical management including injections and PT.  Therefore, microdiscectomy was offered to the patient.  Risks, benefits, alternatives, and expected convalescence were discussed.  Risks discussed included, but were not limited to, bleeding, pain, infection, scar, recurrent disc, instability, CSF leak, weakness, numbness, paralysis, and death.  T informed consent was obtained and the patient wished to proceed.  PROCEDURE IN DETAIL: The patient was brought to the operating room. After induction of general anesthesia, the patient was positioned on the operative table in the prone position on a Wilson frame with all pressure points meticulously padded. The skin of the low back was then prepped and draped in the usual sterile fashion.  Under fluoroscopy, the correct levels were identified and marked out on the skin, and after timeout was conducted, the skin was infiltrated with local anesthetic.  Paramedian skin incision was then made sharply over the affected level and the subcutaneous tissue and fascia were incised.  Initial dilator was then passed  to the interspace under fluoroscopic guidance.  The paraspinous muscle attachments were dissected from the L5 lamina using the dilators.  Successive dilators were used until a 22 mm tubular retractor was placed under fluoroscopic guidance and locked in place with an attachment arm.  Microscope was then introduced into the field.  Small amount of remaining musculature was dissected from the lamina and the interspace was visualized as well.  The overgrown medial facet was also exposed.  High-speed drill was used to perform a laminotomy as well as a modest medial facetectomy.  The ligamentum flavum was then removed with rongeurs.  The thecal sac and traversing nerve root were identified.  The epidural disc space was dissected with a 4 Penfield.  Upon retracting the nerve root medially, the large disc herniation was encountered.  The disc herniation was incised with a 15 blade and pituitary rongeur was used to remove the large disc fragment.  Smaller additional fragments were also removed.  Following removal of the herniated disc, the nerve root appeared much more relaxed.  Woodson probe was used to ensure good decompression and there was no longer significant's impingement of the nerve roots.  Meticulous hemostasis was obtained.  The wound was irrigated thoroughly with bacitracin impregnated irrigation.  Depo-Medrol was then placed over the nerve root.  The tubular retractor was then withdrawn with hemostasis in the muscle obtained with bipolar.  The muscles were injected with half percent Marcaine.  The fascia was closed with 0 Vicryl stitches.  The dermal layer was closed with 2-0 Vicryl stitches in buried interrupted fashion.  The skin was closed with 4-0 Monocryl in subcuticular manner followed by Dermabond.  A sterile dressing was placed.  Patient was then flipped supine  and extubated by the anesthesia service.  All counts were correct at the end of surgery.  No complications were noted.

## 2020-06-27 NOTE — Anesthesia Postprocedure Evaluation (Signed)
Anesthesia Post Note  Patient: Andrea [name]  Procedure(s) Performed: MICRODISCECTOMY LUMBAR FIVE- SACRAL ONE (Left Spine Lumbar)     Patient location during evaluation: PACU Anesthesia Type: General Level of consciousness: awake and alert Pain management: pain level controlled Vital Signs Assessment: post-procedure vital signs reviewed and stable Respiratory status: spontaneous breathing, nonlabored ventilation, respiratory function stable and patient connected to nasal cannula oxygen Cardiovascular status: blood pressure returned to baseline and stable Postop Assessment: no apparent nausea or vomiting Anesthetic complications: no   No complications documented.  Last Vitals:  Vitals:   06/27/20 1125 06/27/20 1210  BP: 138/82 (!) 146/84  Pulse: 69 63  Resp: 10 14  Temp:  36.7 C  SpO2: 95% 99%    Last Pain:  Vitals:   06/27/20 1125  TempSrc:   PainSc: 3                  Darald Uzzle P Mishell Donalson

## 2020-06-27 NOTE — Discharge Instructions (Addendum)
Can remove transparent dressing and shower 48 hours after surgery Walk as much as possible No heavy lifting >10 lbs No excessive bending/twisting at the waist Resume aspirin on 06/28/2020

## 2020-06-27 NOTE — Transfer of Care (Signed)
Immediate Anesthesia Transfer of Care Note  Patient: Andrea [name]  Procedure(s) Performed: MICRODISCECTOMY LUMBAR FIVE- SACRAL ONE (Left Spine Lumbar)  Patient Location: PACU  Anesthesia Type:General  Level of Consciousness: awake, alert , oriented and drowsy  Airway & Oxygen Therapy: Patient Spontanous Breathing and Patient connected to nasal cannula oxygen  Post-op Assessment: Report given to RN and Post -op Vital signs reviewed and stable  Post vital signs: Reviewed and stable  Last Vitals:  Vitals Value Taken Time  BP 156/98 06/27/20 1039  Temp    Pulse 75 06/27/20 1041  Resp 14 06/27/20 1041  SpO2 96 % 06/27/20 1041  Vitals shown include unvalidated device data.  Last Pain:  Vitals:   06/27/20 0657  TempSrc:   PainSc: 10-Worst pain ever      Patients Stated Pain Goal: 2 (06/27/20 0657)  Complications: No complications documented.

## 2020-06-27 NOTE — H&P (Signed)
CC: left leg pain/back pain  HPI:     Patient is a 49 y.o. female with obesity, bipolar disorder, anti-cardiolipin antibody syndrome developed back pain and left leg sciatica despite nonsurgical therapies including injections for several months.  She was found to have a large left L5-S1 disc herniation.      Patient Active Problem List   Diagnosis Date Noted  . Fall (on) (from) other stairs and steps, sequela 03/01/2020  . Chronic fatigue 04/04/2019  . Tobacco abuse 04/04/2019  . Menorrhagia with irregular cycle 01/01/2018  . History of CVA (cerebrovascular accident) 12/01/2017  . Depression with anxiety 12/01/2017  . Lumbar disc disease 12/01/2017  . Fibromyalgia 12/01/2017  . Bipolar disorder (HCC) 12/01/2017   Past Medical History:  Diagnosis Date  . Anticardiolipin antibody syndrome (HCC)   . Bipolar affective (HCC)   . Drug-seeking behavior   . Fibromyalgia   . Sleep apnea   . Stroke [facility])    L sided weakness    Past Surgical History:  Procedure Laterality Date  . CESAREAN SECTION    . TONSILLECTOMY    . TUBAL LIGATION      Medications Prior to Admission  Medication Sig Dispense Refill Last Dose  . aspirin EC 81 MG tablet Take 81 mg by mouth daily. Swallow whole.   06/20/2020  . atorvastatin (LIPITOR) 40 MG tablet Take 1 tablet (40 mg total) by mouth daily. 90 tablet 3 Past Week at Unknown time  . DULoxetine (CYMBALTA) 60 MG capsule Take 1 capsule (60 mg total) by mouth daily. 90 capsule 3 Past Week at Unknown time  . gabapentin (NEURONTIN) 300 MG capsule Take 600 mg by mouth 3 (three) times daily.    06/27/2020 at 0530  . acetaminophen (TYLENOL) 500 MG tablet Take 2 tablets (1,000 mg total) by mouth every 6 (six) hours as needed. (Patient not taking: Reported on 06/20/2020) 100 tablet 2 Not Taking at Unknown time  . hydrocortisone cream 1 % Apply to affected area 2 times daily (Patient not taking: Reported on 06/20/2020) 30 g 1 Not Taking at Unknown time  .  medroxyPROGESTERone (PROVERA) 5 MG tablet Take 2 tablets (10 mg total) by mouth daily. Take when bleeding. (Patient not taking: Reported on 09/19/2019) 30 tablet 1   . meloxicam (MOBIC) 15 MG tablet TAKE 1 Tablet BY MOUTH ONCE EVERY DAY (Patient not taking: Reported on 06/20/2020) 30 tablet 0 Not Taking at Unknown time  . predniSONE (DELTASONE) 20 MG tablet Take 2 tablets (40 mg total) by mouth daily. Take 40 mg by mouth daily for 3 days, then 20mg  by mouth daily for 3 days, then 10mg  daily for 3 days (Patient not taking: Reported on 06/20/2020) 12 tablet 0 Not Taking at Unknown time   Allergies  Allergen Reactions  . Sulfa Antibiotics Hives    Social History   Tobacco Use  . Smoking status: Current Every Day Smoker    Packs/day: 1.00    Types: Cigarettes  . Smokeless tobacco: Never Used  . Tobacco comment: .5-1 PPD  Substance Use Topics  . Alcohol use: Yes    Comment: rarely.    Family History  Adopted: Yes  Problem Relation Age of Onset  . Bipolar disorder Mother   . Fibromyalgia Mother   . Bipolar disorder Maternal Grandmother   . Fibromyalgia Maternal Grandmother   . Arthritis Maternal Grandmother      Review of Systems Pertinent items are noted in HPI.  Objective:   Patient Vitals for the  past 8 hrs:  BP Temp Temp src Pulse Resp SpO2 Height Weight  06/27/20 0653 140/85 98.2 F (36.8 C) Oral 85 18 96 % 5\' 5"  (1.651 m) 111.1 kg   No intake/output data recorded. No intake/output data recorded.    BP 140/85   Pulse 85   Temp 98.2 F (36.8 C) (Oral)   Resp 18   Ht 5\' 5"  (1.651 m)   Wt 111.1 kg   LMP 01/21/2020   SpO2 96%   BMI 40.77 kg/m   General Appearance:    Alert, cooperative, no distress, appears stated age  Head:    Normocephalic, without obvious abnormality, atraumatic  Eyes:    PERRL, conjunctiva/corneas clear, EOM's intact, fundi    benign, both eyes  Ears:    Normal TM's and external ear canals, both ears  Nose:   Nares normal, septum midline,  mucosa normal, no drainage    or sinus tenderness  Throat:   Lips, mucosa, and tongue normal; teeth and gums normal  Neck:   Supple, symmetrical, trachea midline, no adenopathy;    thyroid:  no enlargement/tenderness/nodules; no carotid   bruit or JVD  Back:     Symmetric, no curvature, ROM normal, no CVA tenderness  Lungs:     Clear to auscultation bilaterally, respirations unlabored  Chest Wall:    No tenderness or deformity   Heart:    Regular rate and rhythm, S1 and S2 normal, no murmur, rub   or gallop  Breast Exam:    No tenderness, masses, or nipple abnormality  Abdomen:     Soft, non-tender, bowel sounds active all four quadrants,    no masses, no organomegaly  Genitalia:    Normal female without lesion, discharge or tenderness  Rectal:    Normal tone, normal prostate, no masses or tenderness;   guaiac negative stool  Extremities:   Extremities normal, atraumatic, no cyanosis or edema  Pulses:   2+ and symmetric all extremities  Skin:   Skin color, texture, turgor normal, no rashes or lesions  Lymph nodes:   Cervical, supraclavicular, and axillary nodes normal  Neurologic:   CNII-XII intact, normal strength, sensation and reflexes    Throughout.  + SLR.  Strength testing limited by pain in LLE    Data ReviewRadiology review: MRI reviewed  Assessment:  Large left L5-S1 disc herniation  Plan:   - plan for microdiscectomy today

## 2020-06-28 ENCOUNTER — Encounter (HOSPITAL_COMMUNITY): Payer: Self-pay | Admitting: Neurosurgery

## 2020-08-06 ENCOUNTER — Other Ambulatory Visit: Payer: Self-pay | Admitting: Neurosurgery

## 2020-08-06 DIAGNOSIS — M5416 Radiculopathy, lumbar region: Secondary | ICD-10-CM

## 2020-08-31 ENCOUNTER — Ambulatory Visit
Admission: RE | Admit: 2020-08-31 | Discharge: 2020-08-31 | Disposition: A | Discharge status: Home / Self Care | Payer: 59 | Source: Ambulatory Visit | Attending: Neurosurgery | Admitting: Neurosurgery

## 2020-08-31 ENCOUNTER — Other Ambulatory Visit: Payer: Self-pay

## 2020-08-31 DIAGNOSIS — M5416 Radiculopathy, lumbar region: Secondary | ICD-10-CM

## 2020-08-31 MED ORDER — GADOBENATE DIMEGLUMINE 529 MG/ML IV SOLN
20.0000 mL | Freq: Once | INTRAVENOUS | Status: AC | PRN
  Administered 2020-08-31: 20 mL via INTRAVENOUS

## 2021-02-26 ENCOUNTER — Encounter: Payer: Self-pay | Admitting: *Deleted

## 2021-04-26 IMAGING — DX DG PELVIS 1-2V
1 series · 1 of 1 positions shown · non-contrast
Comparison: None.

CLINICAL DATA: Fall. Left hip and pelvic pain for a few months,
worse over the past 2 weeks.

EXAM:
PELVIS - 1-2 VIEW

[pelvis ap]
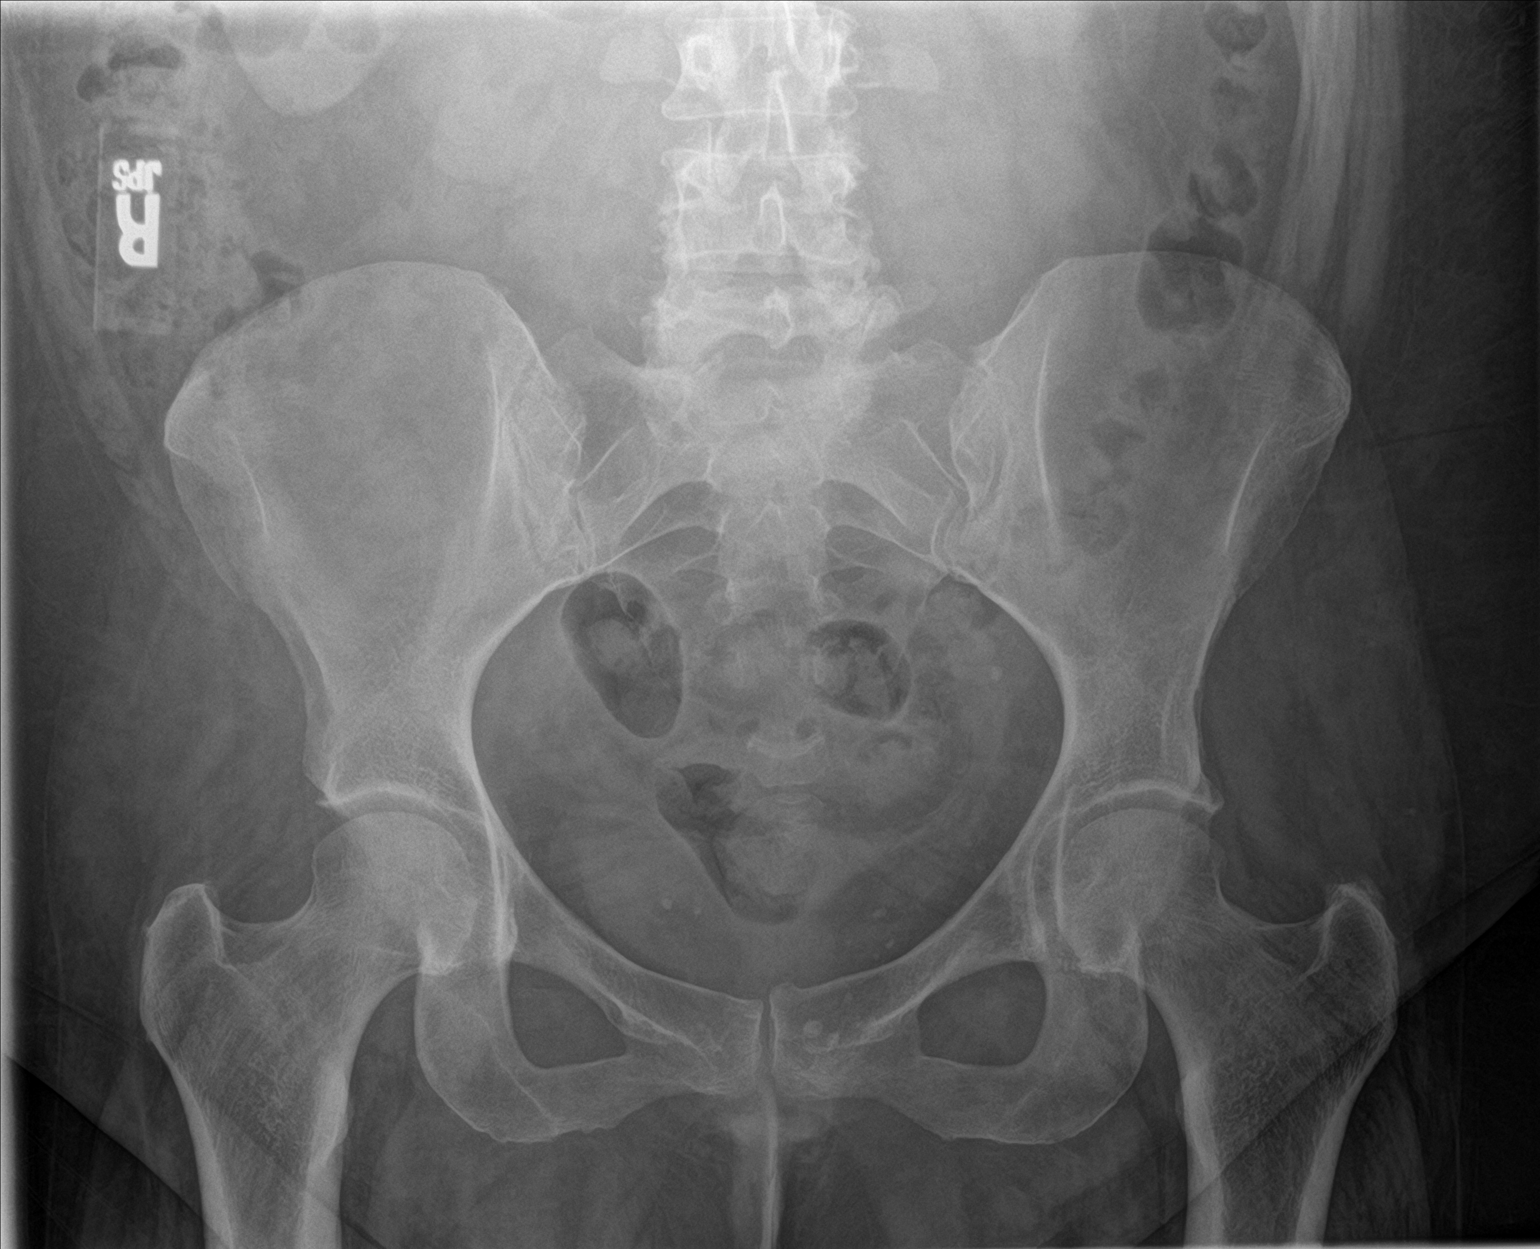

[1 of 1 positions shown; findings below may reference images not displayed]

FINDINGS: The cortical margins of the bony pelvis are intact. No fracture.
Pubic symphysis and sacroiliac joints are congruent. Both femoral
heads are well-seated in the respective acetabula. Minimal bilateral
acetabular spurring. No erosions or evidence of avascular necrosis.
IMPRESSION: Minimal bilateral acetabular spurring.  No fracture.

## 2021-07-31 ENCOUNTER — Encounter: Payer: Self-pay | Admitting: Student

## 2021-07-31 ENCOUNTER — Other Ambulatory Visit (HOSPITAL_COMMUNITY)
Admission: RE | Admit: 2021-07-31 | Discharge: 2021-07-31 | Disposition: A | Discharge status: Home / Self Care | Payer: 59 | Source: Ambulatory Visit | Attending: Internal Medicine | Admitting: Internal Medicine

## 2021-07-31 ENCOUNTER — Ambulatory Visit (INDEPENDENT_AMBULATORY_CARE_PROVIDER_SITE_OTHER): Payer: 59 | Admitting: Student

## 2021-07-31 VITALS — BP 135/90 | HR 96 | Temp 98.4°F | Wt 228.4 lb

## 2021-07-31 DIAGNOSIS — E785 Hyperlipidemia, unspecified: Secondary | ICD-10-CM

## 2021-07-31 DIAGNOSIS — Z720 Tobacco use: Secondary | ICD-10-CM

## 2021-07-31 DIAGNOSIS — Z23 Encounter for immunization: Secondary | ICD-10-CM

## 2021-07-31 DIAGNOSIS — Z8673 Personal history of transient ischemic attack (TIA), and cerebral infarction without residual deficits: Secondary | ICD-10-CM | POA: Diagnosis not present

## 2021-07-31 DIAGNOSIS — F418 Other specified anxiety disorders: Secondary | ICD-10-CM

## 2021-07-31 DIAGNOSIS — Z0000 Encounter for general adult medical examination without abnormal findings: Secondary | ICD-10-CM

## 2021-07-31 DIAGNOSIS — R1310 Dysphagia, unspecified: Secondary | ICD-10-CM | POA: Insufficient documentation

## 2021-07-31 DIAGNOSIS — Z124 Encounter for screening for malignant neoplasm of cervix: Secondary | ICD-10-CM | POA: Insufficient documentation

## 2021-07-31 DIAGNOSIS — M519 Unspecified thoracic, thoracolumbar and lumbosacral intervertebral disc disorder: Secondary | ICD-10-CM

## 2021-07-31 DIAGNOSIS — R5382 Chronic fatigue, unspecified: Secondary | ICD-10-CM

## 2021-07-31 DIAGNOSIS — R1319 Other dysphagia: Secondary | ICD-10-CM | POA: Diagnosis not present

## 2021-07-31 DIAGNOSIS — A599 Trichomoniasis, unspecified: Secondary | ICD-10-CM

## 2021-07-31 NOTE — Assessment & Plan Note (Addendum)
Patient report history of dysphagia started after November 2021.  She reports dysphagia with liquid, solid and also pills.  She reports globus sensation above the sternal notch.  The dysphagia is intermittent and does not happen all the time.  She report swallowing a piece of ham fine this morning but having issue of swallowing her gabapentin tablets last night.  She has several instances that she has to use her finger to get food out of her mouth due to choking sensation.  She denies odynophagia, recent inhaler use or antibiotic use.  She said that her appetite is normal despite the dysphagia.  Said that she is eating the normal amount as before.  She has history of tobacco use, currently smoking 1 pack a day.  She also report a lump below her chin that has been there for a long time.   Assessment and plan Physical exam is unrevealing.  The pharynx was nonerythematous, no lesion, no mass no purulent discharge or white plaques seen.  Besides the small lump palpated in the inferior chin, no thyromegaly or other masses palpated.  This lump could either be a lymph node or inclusion cyst.  Doubt that is causing her symptoms given it is very superficial.  Other concerning factors including weight loss (from 245 to 228 pounds over 1 year), night sweats and history of smoking.  Differentials include esophageal stricture vs head and neck cancer vs oropharyngeal dysphagia.  She did have back surgery last November and was intubated, which can be the culprit of her symptoms.  I will order a barium swallow and refer patient to GI for further evaluation.  She may need an EGD or further imaging.

## 2021-07-31 NOTE — Assessment & Plan Note (Addendum)
-  Pap smear with HPV cotesting performed today. -She was referred to GI for dysphagia.  She can also get a colonoscopy for colon screening after evaluating for dysphagia. -Flu shot today  Addendum Pap smear returned satisfactory.  No evidence of neoplasm.  HPV negative.  Repeat in 5 years

## 2021-07-31 NOTE — Assessment & Plan Note (Addendum)
Patient report history of CVA in 2019 secondary to anticardiolipin antibody.  She was initially started on warfarin and transition to heparin when she was pregnant.  States that she failed to follow-up with blood work so she will transition to aspirin.  She said that she has not been taking her atorvastatin for about a year because she ran out of refill.  Last LDL of 130 in 2019.  -Will repeat lipid panel today -Will also repeat anticardiolipin antibody.  No result was found in chart review.  If positive, can refer patient to hematology for further evaluation.  Addendum -Repeat lipid panel showed elevated LDL at 188.  Resume atorvastatin 40 mg daily -Anticardiolipin antibody test came back negative.  No indication for anticoagulation at this time

## 2021-07-31 NOTE — Assessment & Plan Note (Addendum)
Patient reports smoking 1 pack a day.  States that she smokes to help with anxiety.  I counseled patient on the risk of smoking but she is not motivated to quit.  She was not evaluated by a psychiatrist in the past for her anxiety.  I offered her a referral to Dr. Monna Fam for behavioral counseling to help with her anxiety.  She agreed.  Hopefully she can cut back on smoking when her anxiety is under control.

## 2021-07-31 NOTE — Assessment & Plan Note (Signed)
She reports symptom of chronic fatigue.  Her sister was recently diagnosed with Hashimoto.  She requested a thyroid blood work.  -TSH ordered

## 2021-07-31 NOTE — Progress Notes (Signed)
   CC: dysphagia   HPI:  [name] is a 50 y.o. with past medical history of CVA, lumbar disc disease, depression who presents to the clinic today for dysphagia.  Please see problem based charting for details.   Past Medical History:  Diagnosis Date   Anticardiolipin antibody syndrome (HCC)    Bipolar affective (HCC)    Drug-seeking behavior    Fibromyalgia    Sleep apnea    Stroke (HCC)    L sided weakness   Review of Systems:  per HPI  Physical Exam:  Vitals:   07/31/21 1348  BP: 135/90  Pulse: 96  Temp: 98.4 F (36.9 C)  TempSrc: Oral  SpO2: 100%  Weight: 228 lb 6.4 oz (103.6 kg)   Physical Exam Constitutional:      General: She is not in acute distress.    Appearance: She is not ill-appearing.  HENT:     Head: Normocephalic.     Mouth/Throat:     Comments: Moist mucosal membrane.  Pharynx not erythematous, no lesion, no mass, no purulent discharge, no white plaques.  Could not visualize the posterior pharynx completely due to anatomy.  Tongue is normal.  Poor dentition. Eyes:     General:        Right eye: No discharge.        Left eye: No discharge.     Conjunctiva/sclera: Conjunctivae normal.  Neck:     Comments: There is a small, round, smooth mass palpated below the chin.  Approximately 1 cm in diameter.  Mass is mobile, nontender to palpation.  No thyromegaly or other masses palpated Cardiovascular:     Rate and Rhythm: Normal rate and regular rhythm.     Heart sounds: Normal heart sounds.  Pulmonary:     Effort: Pulmonary effort is normal. No respiratory distress.     Breath sounds: Normal breath sounds. No wheezing.  Abdominal:     General: Bowel sounds are normal. There is no distension.     Palpations: Abdomen is soft.     Tenderness: There is no abdominal tenderness.  Musculoskeletal:        General: Normal range of motion.  Skin:    General: Skin is warm.     Coloration: Skin is not jaundiced.  Neurological:     General: No  focal deficit present.     Mental Status: She is alert and oriented to person, place, and time.  Psychiatric:        Mood and Affect: Mood normal.        Behavior: Behavior normal.     Assessment & Plan:   See Encounters Tab for problem based charting.  Patient discussed with Dr.  Sol Blazing

## 2021-07-31 NOTE — Patient Instructions (Signed)
Andrea [name],   It was a pleasure seeing you in the clinic today.  Here is a summary of what we talked about:  1.  Problems swallowing: I will order a barium swallow and place a referral to a gastroenterologist.  They may need to do an endoscopy to look for causes of your dysphagia.  2.  History of cardiolipin antibody: I will reorder the antibody for the cardiolipin.  I will also order lipid panel today.  3.  Tobacco use: Please cut back on smoking.  We have resources to help you quit.  Please return in 3 months  Take care,  Dr. Cyndie Chime

## 2021-08-02 LAB — CARDIOLIPIN ANTIBODIES, IGG, IGM, IGA
Anticardiolipin IgA: <9 APL U/mL (ref 0–11)
Anticardiolipin IgG: <9 GPL U/mL (ref 0–14)
Anticardiolipin IgM: <9 MPL U/mL (ref 0–12)

## 2021-08-02 LAB — LIPID PANEL
Chol/HDL Ratio: 7.3 ratio — ABNORMAL HIGH (ref 0.0–4.4)
Cholesterol, Total: 271 mg/dL — ABNORMAL HIGH (ref 100–199)
HDL: 37 mg/dL — ABNORMAL LOW (ref >39)
LDL Chol Calc (NIH): 188 mg/dL — ABNORMAL HIGH (ref 0–99)
Triglycerides: 238 mg/dL — ABNORMAL HIGH (ref 0–149)
VLDL Cholesterol Cal: 46 mg/dL — ABNORMAL HIGH (ref 5–40)

## 2021-08-02 LAB — TSH: TSH: 2.43 u[IU]/mL (ref 0.450–4.500)

## 2021-08-02 NOTE — Addendum Note (Signed)
Addended byDoran Stabler on: 08/02/2021 04:28 PM   Modules accepted: Orders

## 2021-08-02 NOTE — Addendum Note (Signed)
Addended byDoran Stabler on: 08/02/2021 04:25 PM   Modules accepted: Orders

## 2021-08-05 LAB — CYTOLOGY - PAP
Adequacy: ABSENT
Comment: NEGATIVE
Diagnosis: NEGATIVE
High risk HPV: NEGATIVE

## 2021-08-05 NOTE — Progress Notes (Signed)
Internal Medicine Clinic Attending  Case discussed with Dr. Nguyen  At the time of the visit.  We reviewed the resident's history and exam and pertinent patient test results.  I agree with the assessment, diagnosis, and plan of care documented in the resident's note. 

## 2021-08-06 DIAGNOSIS — E785 Hyperlipidemia, unspecified: Secondary | ICD-10-CM | POA: Insufficient documentation

## 2021-08-06 DIAGNOSIS — A599 Trichomoniasis, unspecified: Secondary | ICD-10-CM | POA: Insufficient documentation

## 2021-08-06 MED ORDER — DULOXETINE HCL 60 MG PO CPEP: 60.0000 mg | ORAL_CAPSULE | Freq: Every day | ORAL | 3 refills | Status: DC

## 2021-08-06 MED ORDER — METRONIDAZOLE 500 MG PO TABS: 500.0000 mg | ORAL_TABLET | Freq: Two times a day (BID) | ORAL | 0 refills | Status: AC

## 2021-08-06 MED ORDER — MELOXICAM 15 MG PO TABS: 15.0000 mg | ORAL_TABLET | Freq: Every day | ORAL | 0 refills | Status: AC

## 2021-08-06 MED ORDER — GABAPENTIN 300 MG PO CAPS: 600.0000 mg | ORAL_CAPSULE | Freq: Three times a day (TID) | ORAL | 2 refills | Status: DC

## 2021-08-06 MED ORDER — ATORVASTATIN CALCIUM 40 MG PO TABS: 40.0000 mg | ORAL_TABLET | Freq: Every day | ORAL | 3 refills | Status: DC

## 2021-08-06 NOTE — Addendum Note (Signed)
Addended byDoran Stabler on: 08/06/2021 02:20 PM   Modules accepted: Orders

## 2021-08-06 NOTE — Assessment & Plan Note (Addendum)
Patient has not been taking her Lipitor for at least a year.  Repeat lipid panel showed significantly elevated LDL at 188.  -Resume Lipitor 40 mg -Repeat lipid panel in 3 months

## 2021-08-06 NOTE — Assessment & Plan Note (Addendum)
-  Resume Cymbalta 60 mg daily -Resume gabapentin 600 mg TID -Start 7 days of meloxicam

## 2021-08-06 NOTE — Assessment & Plan Note (Signed)
Pap smear cytology was positive for trichomonas.  She was positive for trichomonas in 2019 and reports finished treatment.  -Start metronidazole 500 mg twice daily for 7 days -Will call in 1 dose of metronidazole 2 g for her partner

## 2021-08-07 ENCOUNTER — Encounter: Payer: Self-pay | Admitting: Student

## 2021-08-20 ENCOUNTER — Ambulatory Visit: Payer: 59 | Admitting: Gastroenterology

## 2021-09-02 ENCOUNTER — Institutional Professional Consult (permissible substitution): Payer: Self-pay | Admitting: Behavioral Health

## 2021-09-02 ENCOUNTER — Telehealth: Payer: Self-pay | Admitting: Behavioral Health

## 2021-09-02 NOTE — Telephone Encounter (Signed)
Lft 2 msgs for Pt's IBH telehealth call today prior to reaching her @ 1:15pm. Pt apologized & states she is @ work; she has a new job. Let Pt know we will r/s her telehealth session which may take 1-2 wks. Pt acknowledged, apologized, & requested r/s.  Dr. Monna Fam

## 2021-09-25 ENCOUNTER — Ambulatory Visit: Payer: Self-pay | Admitting: Behavioral Health

## 2021-09-25 DIAGNOSIS — F4381 Prolonged grief disorder: Secondary | ICD-10-CM

## 2021-09-25 DIAGNOSIS — F418 Other specified anxiety disorders: Secondary | ICD-10-CM

## 2021-09-25 DIAGNOSIS — F4329 Adjustment disorder with other symptoms: Secondary | ICD-10-CM
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
## 2021-10-22 ENCOUNTER — Telehealth: Payer: Self-pay | Admitting: Behavioral Health

## 2021-10-22 ENCOUNTER — Institutional Professional Consult (permissible substitution): Payer: Self-pay | Admitting: Behavioral Health

## 2021-10-22 NOTE — Telephone Encounter (Signed)
Lft 3 msgs for Pt re: today's 10:00am [facility] telehealth session. Requested Pt call & r/s @ her convenience.  Dr. Monna Fam

## 2021-11-05 ENCOUNTER — Ambulatory Visit (HOSPITAL_COMMUNITY): Admission: RE | Admit: 2021-11-05 | Payer: Self-pay | Source: Ambulatory Visit

## 2021-12-12 ENCOUNTER — Encounter: Payer: Self-pay | Admitting: Student

## 2021-12-12 ENCOUNTER — Ambulatory Visit: Payer: BC Managed Care – PPO | Admitting: Student

## 2021-12-12 VITALS — BP 108/74 | HR 80 | Wt 210.4 lb

## 2021-12-12 DIAGNOSIS — R1319 Other dysphagia: Secondary | ICD-10-CM

## 2021-12-12 DIAGNOSIS — F418 Other specified anxiety disorders: Secondary | ICD-10-CM | POA: Diagnosis not present

## 2021-12-12 DIAGNOSIS — Z1231 Encounter for screening mammogram for malignant neoplasm of breast: Secondary | ICD-10-CM

## 2021-12-12 DIAGNOSIS — Z8673 Personal history of transient ischemic attack (TIA), and cerebral infarction without residual deficits: Secondary | ICD-10-CM

## 2021-12-12 NOTE — Patient Instructions (Signed)
Andrea [name], ? ?I am glad that you are doing better and of obtaining insurance. ? ?1.  I placed a referral to a gastroenterologist and order a barium swallow test. ? ?2.  Please try to cut back on smoking as much as you can.  Please let us know if you need any resources. ? ?3.  Please pick up your atorvastatin ? ?4.  I placed an order for mammogram. ? ?Please return in 3 months ? ?Dr. Alfonse Spruce ?

## 2021-12-12 NOTE — Assessment & Plan Note (Signed)
She currently taking aspirin 81 mg daily. ?Has not picked up her atorvastatin due to insurance issue.  Said that she will pick it up today. ? ?-Repeat lipid panel in next visit ?

## 2021-12-12 NOTE — Assessment & Plan Note (Signed)
Patient endorses persistent dysphagia.  This time she states that her symptoms mainly occur with liquid food.  Endorses globus sensation upper neck and external mouth.  Dysphagia only happen intermittently.  Endorses an episode that she has to use her finger to pull the food out of her mouth last month. ? ?She did not follow-up with GI or barium swallow due to the lack of insurance.  She is now obtaining insurance from work. ? ?Physical exam was nonrevealing.  There was no thyromegaly or superficial masses palpated.  No lymphadenopathy.  Poor dentition with missing teeth noted.  No obvious pathology sitting in her mouth. ? ?Her weight came down from 228 to 210 from last visit.  States that she could not eat given her dysphagia and also dental problem. ? ?Patient still smoking but reduced to 1 pack every 3 days.  We discussed risk of head and neck cancer with her smoking.  She is not motivated to quit yet. ? ?-Reorder barium swallow and GI referral for possible EGD ?-Continue discussion of smoking cessation. ?-Patient will also find a dentist under her insurance plan ?

## 2021-12-12 NOTE — Assessment & Plan Note (Signed)
Patient has been following with Dr. Sallyanne KusterWinsted. ?She has not been taking Cymbalta due to insurance issue ?She recently obtained insurance from work.  She will pick up Cymbalta today. ?

## 2021-12-12 NOTE — Progress Notes (Signed)
? ?CC: Follow-up on dysphagia ? ?HPI: ? ?[name] is a 51 y.o. with PMH with past medical history of CVA, hyperlipidemia who presents to clinic today to follow-up on her chest pain issues. ? ?Please see problem based charting for detail ? ?Past Medical History:  ?Diagnosis Date  ? Anticardiolipin antibody syndrome (HCC)   ? Bipolar affective (HCC)   ? Drug-seeking behavior   ? Fibromyalgia   ? Sleep apnea   ? Stroke [facility])   ? L sided weakness  ? ?Review of Systems:  per HPI ? ?Physical Exam: ? ?Vitals:  ? 12/12/21 0949  ?BP: 108/74  ?Pulse: 80  ?SpO2: 98%  ?Weight: 210 lb 6.4 oz (95.4 kg)  ? ?Physical Exam ?Constitutional:   ?   General: She is not in acute distress. ?   Appearance: She is not ill-appearing.  ?HENT:  ?   Head:  ?   Comments: No thyromegaly.  No superficial mass palpated.  No lymphadenopathy. ?   Mouth/Throat:  ?   Comments: Poor dentition.  Missing teeth.  No obvious pathology seen in her mouth. ?Eyes:  ?   General:     ?   Right eye: No discharge.     ?   Left eye: No discharge.  ?   Conjunctiva/sclera: Conjunctivae normal.  ?Cardiovascular:  ?   Rate and Rhythm: Normal rate and regular rhythm.  ?Pulmonary:  ?   Effort: Pulmonary effort is normal.  ?   Breath sounds: Normal breath sounds.  ?Musculoskeletal:     ?   General: Normal range of motion.  ?Skin: ?   General: Skin is warm.  ?Neurological:  ?   General: No focal deficit present.  ?   Mental Status: She is alert.  ?Psychiatric:     ?   Mood and Affect: Mood normal.  ?  ? ?Assessment & Plan:  ? ?See Encounters Tab for problem based charting. ? ?Depression with anxiety ?Patient has been following with Dr. Sallyanne Kuster. ?She has not been taking Cymbalta due to insurance issue ?She recently obtained insurance from work.  She will pick up Cymbalta today. ? ?History of CVA (cerebrovascular accident) ?She currently taking aspirin 81 mg daily. ?Has not picked up her atorvastatin due to insurance issue.  Said that she will pick it up  today. ? ?-Repeat lipid panel in next visit ? ?Dysphagia ?Patient endorses persistent dysphagia.  This time she states that her symptoms mainly occur with liquid food.  Endorses globus sensation upper neck and external mouth.  Dysphagia only happen intermittently.  Endorses an episode that she has to use her finger to pull the food out of her mouth last month. ? ?She did not follow-up with GI or barium swallow due to the lack of insurance.  She is now obtaining insurance from work. ? ?Physical exam was nonrevealing.  There was no thyromegaly or superficial masses palpated.  No lymphadenopathy.  Poor dentition with missing teeth noted.  No obvious pathology sitting in her mouth. ? ?Her weight came down from 228 to 210 from last visit.  States that she could not eat given her dysphagia and also dental problem. ? ?Patient still smoking but reduced to 1 pack every 3 days.  We discussed risk of head and neck cancer with her smoking.  She is not motivated to quit yet. ? ?-Reorder barium swallow and GI referral for possible EGD ?-Continue discussion of smoking cessation. ?-Patient will also find a dentist under her insurance plan  ? ?  Patient discussed with Dr. Heide SparkNarendra  ?

## 2021-12-13 NOTE — Progress Notes (Signed)
Internal Medicine Clinic Attending  Case discussed with Dr. Nguyen  At the time of the visit.  We reviewed the resident's history and exam and pertinent patient test results.  I agree with the assessment, diagnosis, and plan of care documented in the resident's note. 

## 2022-02-11 ENCOUNTER — Encounter: Payer: Self-pay | Admitting: Internal Medicine

## 2022-02-11 ENCOUNTER — Ambulatory Visit (INDEPENDENT_AMBULATORY_CARE_PROVIDER_SITE_OTHER): Payer: BC Managed Care – PPO | Admitting: Internal Medicine

## 2022-02-11 VITALS — BP 114/68 | HR 68 | Wt 211.4 lb

## 2022-02-11 DIAGNOSIS — T148XXA Other injury of unspecified body region, initial encounter: Secondary | ICD-10-CM

## 2022-02-11 DIAGNOSIS — E785 Hyperlipidemia, unspecified: Secondary | ICD-10-CM

## 2022-02-11 DIAGNOSIS — M549 Dorsalgia, unspecified: Secondary | ICD-10-CM

## 2022-02-11 DIAGNOSIS — M546 Pain in thoracic spine: Secondary | ICD-10-CM

## 2022-02-11 DIAGNOSIS — M519 Unspecified thoracic, thoracolumbar and lumbosacral intervertebral disc disorder: Secondary | ICD-10-CM

## 2022-02-11 MED ORDER — CYCLOBENZAPRINE HCL 5 MG PO TABS: 5.0000 mg | ORAL_TABLET | Freq: Three times a day (TID) | ORAL | 0 refills | Status: AC | PRN

## 2022-02-11 MED ORDER — NAPROXEN 500 MG PO TABS: 500.0000 mg | ORAL_TABLET | Freq: Two times a day (BID) | ORAL | 0 refills | Status: AC

## 2022-02-11 NOTE — Progress Notes (Signed)
   CC: Upper Back Pain  HPI:  Ms.Bricia N Kil is a 51 y.o. with medical history as below presenting to [facility] for right sided upper back pain.   Please see problem-based list for further details, assessments, and plans.  Past Medical History:  Diagnosis Date   Anticardiolipin antibody syndrome (HCC)    Bipolar affective (HCC)    Drug-seeking behavior    Fibromyalgia    Sleep apnea    Stroke (HCC)    L sided weakness     Current Outpatient Medications (Cardiovascular):    atorvastatin (LIPITOR) 40 MG tablet, Take 1 tablet (40 mg total) by mouth daily.   Current Outpatient Medications (Analgesics):    aspirin EC 81 MG tablet, Take 81 mg by mouth daily. Swallow whole.   Current Outpatient Medications (Other):    cyclobenzaprine (FLEXERIL) 10 MG tablet, Take 1 tablet (10 mg total) by mouth 3 (three) times daily as needed for muscle spasms.   DULoxetine (CYMBALTA) 60 MG capsule, Take 1 capsule (60 mg total) by mouth daily.   gabapentin (NEURONTIN) 300 MG capsule, Take 2 capsules (600 mg total) by mouth 3 (three) times daily.  Review of Systems:  Review of system negative unless stated in the problem list or HPI.    Physical Exam:  Vitals:   02/11/22 1539  BP: 114/68  Pulse: 68  SpO2: 99%  Weight: 211 lb 6.4 oz (95.9 kg)    Physical Exam General: NAD HENT: NCAT Lungs: CTAB, no wheeze, rhonchi or rales.  Cardiovascular: Normal heart sounds, no r/m/g, 2+ pulses in all extremities. No LE edema Abdomen: No TTP, normal bowel sounds MSK: No asymmetry or muscle atrophy.  Skin: no lesions noted on exposed skin Neuro: Alert and oriented x4. CN grossly intact Psych: Normal mood and normal affect   Assessment & Plan:   See Encounters Tab for problem based charting.  Patient discussed with Dr. Thersa Salt, MD

## 2022-02-11 NOTE — Patient Instructions (Signed)
Ms.Destani Merry Proud, it was a pleasure seeing you today! You endorsed feeling well today. Below are some of the things we talked about this visit. We look forward to seeing you in the follow up appointment!  Today we discussed: You presented to the clinic with back pain.  It appears that is due to a muscle strain.  You do not have any concerning symptoms of fevers or chills or weakness to warrant imaging.  We will give you a muscle relaxer, NSAID called naproxen and recommend using Voltaren gel.   I would like for you to take your medications including your cholesterol medicine, Cymbalta, and gabapentin.  Please continue taking your aspirin daily.  I am also placing referral to physical therapy which will help you with the back pain.  We will order that limits your lifting at work to 10 pounds.  Please follow-up in 2 weeks and we will reevaluate you.  I have ordered the following labs today:  Lab Orders  No laboratory test(s) ordered today      Referrals ordered today:   Referral Orders  No referral(s) requested today     I have ordered the following medication/changed the following medications:   Stop the following medications: Medications Discontinued During This Encounter  Medication Reason   cyclobenzaprine (FLEXERIL) 10 MG tablet      Start the following medications: Meds ordered this encounter  Medications   cyclobenzaprine (FLEXERIL) 5 MG tablet    Sig: Take 1 tablet (5 mg total) by mouth 3 (three) times daily as needed for up to 14 days for muscle spasms.    Dispense:  30 tablet    Refill:  0   naproxen (NAPROSYN) 500 MG tablet    Sig: Take 1 tablet (500 mg total) by mouth 2 (two) times daily with a meal for 14 days.    Dispense:  28 tablet    Refill:  0     Follow-up: 2 weeks  Please make sure to arrive 15 minutes prior to your next appointment. If you arrive late, you may be asked to reschedule.   We look forward to seeing you next time. Please call our clinic at  [phone] if you have any questions or concerns. The best time to call is Monday-Friday from 9am-4pm, but there is someone available 24/7. If after hours or the weekend, call the main hospital number and ask for the Internal Medicine Resident On-Call. If you need medication refills, please notify your pharmacy one week in advance and they will send [place] a request.  Thank you for letting us take part in your care. Wishing you the best!  Thank you, Gwenevere Abbot, MD

## 2022-02-12 DIAGNOSIS — M549 Dorsalgia, unspecified: Secondary | ICD-10-CM | POA: Insufficient documentation

## 2022-02-12 NOTE — Assessment & Plan Note (Addendum)
Patient presents with right-sided upper back pain which appears to be consistent with muscle strain. Less concern for PE given reassuring vitals, no LE swelling, no period of inactivity.  I advised her to continue conservative therapy but limit her NSAID use.  I prescribed her naproxen 500 mg twice daily for 2 weeks for the pain and instructed her to not take any NSAID containing medicines over-the-counter.  I advised her to continue her aspirin 81 mg with her history of stroke.  I also prescribed her Flexeril 5 mg up to 3 times a day as needed and offered her physical therapy which she accepted.  I gave her light back exercises to do to help with the pain. - Start taking naproxen 500 mg twice daily - Start Flexeril 5 mg 3 times daily as needed (avoid driving or heavy machinery use while taking this medication) - Physical therapy referral - A note to limit her lifting to 10 pounds for 2 weeks. -Follow up in 2 weeks if no improvement for re-evaluation.

## 2022-02-13 MED ORDER — GABAPENTIN 300 MG PO CAPS: 600.0000 mg | ORAL_CAPSULE | Freq: Three times a day (TID) | ORAL | 2 refills | Status: DC

## 2022-02-13 NOTE — Assessment & Plan Note (Signed)
Not taking lipitor regularly so I encouraged her to take this medication regularly as she has hx of cerebral infarction and is high risk for another event.  -Continue lipitor 40 mg daily -Recheck lipid panel at next visit.

## 2022-02-18 ENCOUNTER — Telehealth: Payer: Self-pay

## 2022-02-26 NOTE — Telephone Encounter (Signed)
DECISION:    Outcome  Approved today  Your PA request has been approved.   Additional information will be provided in the approval communication. <no modification to standard approval letter template-internal notes to populate internally only>    Drug Gabapentin 300MG  capsules   Form [employer] Electronic PA Form (2017 NCPDP) Original C      COPY SENT TO PHARMACY ALSO

## 2022-03-03 ENCOUNTER — Ambulatory Visit: Payer: BC Managed Care – PPO | Admitting: Physical Therapy

## 2022-07-30 ENCOUNTER — Telehealth: Payer: Self-pay | Admitting: *Deleted

## 2022-07-30 ENCOUNTER — Ambulatory Visit: Payer: BC Managed Care – PPO | Admitting: Internal Medicine

## 2022-07-30 VITALS — BP 126/73 | HR 89 | Temp 98.4°F | Wt 213.9 lb

## 2022-07-30 DIAGNOSIS — M519 Unspecified thoracic, thoracolumbar and lumbosacral intervertebral disc disorder: Secondary | ICD-10-CM

## 2022-07-30 DIAGNOSIS — Z1211 Encounter for screening for malignant neoplasm of colon: Secondary | ICD-10-CM

## 2022-07-30 DIAGNOSIS — E785 Hyperlipidemia, unspecified: Secondary | ICD-10-CM | POA: Diagnosis not present

## 2022-07-30 DIAGNOSIS — R252 Cramp and spasm: Secondary | ICD-10-CM | POA: Diagnosis not present

## 2022-07-30 DIAGNOSIS — R232 Flushing: Secondary | ICD-10-CM | POA: Diagnosis not present

## 2022-07-30 DIAGNOSIS — Z23 Encounter for immunization: Secondary | ICD-10-CM | POA: Diagnosis not present

## 2022-07-30 DIAGNOSIS — Z8673 Personal history of transient ischemic attack (TIA), and cerebral infarction without residual deficits: Secondary | ICD-10-CM

## 2022-07-30 DIAGNOSIS — R6889 Other general symptoms and signs: Secondary | ICD-10-CM | POA: Diagnosis not present

## 2022-07-30 HISTORY — DX: Encounter for screening for malignant neoplasm of colon: Z12.11

## 2022-07-30 MED ORDER — DULOXETINE HCL 30 MG PO CPEP: ORAL_CAPSULE | ORAL | 2 refills | Status: DC

## 2022-07-30 MED ORDER — GABAPENTIN 300 MG PO CAPS: 600.0000 mg | ORAL_CAPSULE | Freq: Three times a day (TID) | ORAL | 2 refills | Status: DC

## 2022-07-30 MED ORDER — ATORVASTATIN CALCIUM 40 MG PO TABS: 40.0000 mg | ORAL_TABLET | Freq: Every day | ORAL | 3 refills | Status: DC

## 2022-07-30 MED ORDER — ASPIRIN 81 MG PO TBEC: 81.0000 mg | DELAYED_RELEASE_TABLET | Freq: Every day | ORAL | 2 refills | Status: AC

## 2022-07-30 NOTE — Progress Notes (Unsigned)
CC: hot flashes, foot/leg pain  HPI:  [name] is a 51 y.o. person with past medical history as detailed below who presents today complaining of hot flashes and foot/leg pain. Please see problem based charting for detailed assessment and plan.  Past Medical History:  Diagnosis Date   Anticardiolipin antibody syndrome (HCC)    Bipolar affective (HCC)    Drug-seeking behavior    Fibromyalgia    Sleep apnea    Stroke (HCC)    L sided weakness   Review of Systems:  Negative unless otherwise stated.  Physical Exam:  Vitals:   07/30/22 0921  BP: 126/73  Pulse: 89  Temp: 98.4 F (36.9 C)  TempSrc: Oral  SpO2: 100%  Weight: 213 lb 14.4 oz (97 kg)   Constitutional:Appears stated age, well. In no acute distress. Cardio:Regular rate and rhythm. No murmurs, rubs, or gallops. Pulm:Clear to auscultation bilaterally. Normal work of breathing on room air. JFH:[id number] for extremity edema. Skin:Warm and dry. Skin over bilateral feet is ashen and very dry. No open sores or cuts appreciated on the feet or in interdigit spaces. Neuro:Alert and oriented x3. No focal deficit noted. Psych:Pleasant mood and affect.   Assessment & Plan:   See Encounters Tab for problem based charting.  Encounter for screening colonoscopy Referral placed to GI.  Need for immunization against influenza Flu vaccine given.  Hot flashes Patient reports that she has been having hot flashes for several months but is unable to say exactly when they started. Her last menstrual cycle was around 1.5 years ago. She was previously taking duloxetine but ran out of the medication in or around May and thinks that her symptoms may have started at that time.  Assessment:Duloxetine does have benefit of alleviating menopause symptoms for some women. Given the correlation of the onset of symptoms with patient discontinuing duloxetine, I suspect the medication was helping with her symptoms of menopause. She denies  other symptoms that would lead me to worry about thyroid disease and TSH checked ~1 year ago was normal. Plan:Restart duloxetine, 30 mg daily for 7 days then increase to 60 thereafter. Will recheck TSH today as well.  History of CVA (cerebrovascular accident) ASA 81 mg daily refilled. Patient has been out of her medications for several months.  Hyperlipidemia Atorvastatin refilled. Patient has been out of her medications for several months. Please recheck lipid panel at next OV when she has been more consistently taking her medication.  Lumbar disc disease Patient has long-standing history of chronic low back pain due to lumbar disc disease. She has previously been prescribed duloxetine 60 mg daily and gabapentin 600 mg TID. She has not been taking duloxetine consistently but reports taking gabapentin--dispense reports are not consistent with regular use, however. She does report having some back pain and leg cramping in the last week that became severe. Please see "muscle cramps at night" for further details. No recent trauma or injury. Plan: Refills for gabapentin and duloxetine sent in today.  Muscle cramps at night Patient reports muscle cramps in her legs and feet that are particularly bothersome at night. She has chronic pain due to lumbar disc disease and does not appear to have been taking gabapentin or duloxetine consistently, if at all. She states that the cramping begins in her back but is most bothersome in her feet, and that last week muscle cramping became so severe that she nearly called EMS. She also has severe itching of the feet and is constantly scratching them.  They burn and feel achy. She does not have diabetes. She does not know exactly how long she has been experiencing these symptoms. She does not have a restless sensation and moving her feet and legs does not alleviate the discomfort. She does try to take in adequate PO intake however due to poor dentition this can be  difficult.  Assessment:I suspect her discomfort stems from extremely dry feet. No prior history of iron deficiency, vitamin B12 deficiency, thyroid abnormalities, or electrolyte imbalances. Plan:Will check BMP, iron studies, vitamin B12, and TSH and T4 to assess reversible causes of her cramping and severe itch. I have advised her to generously apply Aquaphor or Eucerin cream over her feet several times per day to help the skin heal and hold better moisture. I have advised that she do her best to stop scratching aggressively at her feet.  Patient discussed with Dr. Heide Spark

## 2022-07-30 NOTE — Telephone Encounter (Signed)
Patient called in from pharmacy stating she forgot to ask Provider for Rx for muscle spasms. States spasms started 2 years ago following back surgery. They are on the right back and extend down both legs R > L. States one week ago had severe muscle spasms during the night. BF almost called 911 they were so severe.

## 2022-07-30 NOTE — Patient Instructions (Addendum)
Andrea [name],  It was a pleasure to care for you today.   I have sent refills for your medications, including atorvastatin, gabapentin, and duloxetine. Please start duloxetine at 30 mg per day for 1 week, then increase to 60 mg per day after that. I also recommend that you apply a moisturizer to your feet at least once daily--you can try Aquaphor.  You will receive a call from gastroenterology to schedule your appointment to discuss a colonoscopy. I will let you know what your lab results show.  Please follow up in about 1-2 months.   My best, Dr. August Saucer

## 2022-07-31 DIAGNOSIS — R232 Flushing: Secondary | ICD-10-CM | POA: Insufficient documentation

## 2022-07-31 DIAGNOSIS — R252 Cramp and spasm: Secondary | ICD-10-CM | POA: Insufficient documentation

## 2022-07-31 LAB — BMP8+ANION GAP
Anion Gap: 15 mmol/L (ref 10.0–18.0)
BUN/Creatinine Ratio: 28 — ABNORMAL HIGH (ref 9–23)
BUN: 19 mg/dL (ref 6–24)
CO2: 20 mmol/L (ref 20–29)
Calcium: 9.3 mg/dL (ref 8.7–10.2)
Chloride: 104 mmol/L (ref 96–106)
Creatinine, Ser: 0.68 mg/dL (ref 0.57–1.00)
Glucose: 109 mg/dL — ABNORMAL HIGH (ref 70–99)
Potassium: 4.1 mmol/L (ref 3.5–5.2)
Sodium: 139 mmol/L (ref 134–144)
eGFR: 105 mL/min/{1.73_m2} (ref >59)

## 2022-07-31 LAB — TSH: TSH: 2.47 u[IU]/mL (ref 0.450–4.500)

## 2022-07-31 LAB — IRON,TIBC AND FERRITIN PANEL
Ferritin: 34 ng/mL (ref 15–150)
Iron Saturation: 34 % (ref 15–55)
Iron: 112 ug/dL (ref 27–159)
Total Iron Binding Capacity: 328 ug/dL (ref 250–450)
UIBC: 216 ug/dL (ref 131–425)

## 2022-07-31 LAB — T4, FREE: Free T4: 1.19 ng/dL (ref 0.82–1.77)

## 2022-07-31 LAB — VITAMIN B12: Vitamin B-12: 375 pg/mL (ref 232–1245)

## 2022-07-31 NOTE — Assessment & Plan Note (Signed)
Patient has long-standing history of chronic low back pain due to lumbar disc disease. She has previously been prescribed duloxetine 60 mg daily and gabapentin 600 mg TID. She has not been taking duloxetine consistently but reports taking gabapentin--dispense reports are not consistent with regular use, however. She does report having some back pain and leg cramping in the last week that became severe. Please see "muscle cramps at night" for further details. No recent trauma or injury. Plan: Refills for gabapentin and duloxetine sent in today.

## 2022-07-31 NOTE — Assessment & Plan Note (Signed)
Referral placed to GI 

## 2022-07-31 NOTE — Assessment & Plan Note (Signed)
Flu vaccine given.

## 2022-07-31 NOTE — Assessment & Plan Note (Signed)
Atorvastatin refilled. Patient has been out of her medications for several months. Please recheck lipid panel at next OV when she has been more consistently taking her medication.

## 2022-07-31 NOTE — Assessment & Plan Note (Signed)
ASA 81 mg daily refilled. Patient has been out of her medications for several months.

## 2022-07-31 NOTE — Assessment & Plan Note (Signed)
Patient reports that she has been having hot flashes for several months but is unable to say exactly when they started. Her last menstrual cycle was around 1.5 years ago. She was previously taking duloxetine but ran out of the medication in or around May and thinks that her symptoms may have started at that time.  Assessment:Duloxetine does have benefit of alleviating menopause symptoms for some women. Given the correlation of the onset of symptoms with patient discontinuing duloxetine, I suspect the medication was helping with her symptoms of menopause. She denies other symptoms that would lead me to worry about thyroid disease and TSH checked ~1 year ago was normal. Plan:Restart duloxetine, 30 mg daily for 7 days then increase to 60 thereafter. Will recheck TSH today as well.

## 2022-07-31 NOTE — Assessment & Plan Note (Signed)
Patient reports muscle cramps in her legs and feet that are particularly bothersome at night. She has chronic pain due to lumbar disc disease and does not appear to have been taking gabapentin or duloxetine consistently, if at all. She states that the cramping begins in her back but is most bothersome in her feet, and that last week muscle cramping became so severe that she nearly called EMS. She also has severe itching of the feet and is constantly scratching them. They burn and feel achy. She does not have diabetes. She does not know exactly how long she has been experiencing these symptoms. She does not have a restless sensation and moving her feet and legs does not alleviate the discomfort. She does try to take in adequate PO intake however due to poor dentition this can be difficult.  Assessment:I suspect her discomfort stems from extremely dry feet. No prior history of iron deficiency, vitamin B12 deficiency, thyroid abnormalities, or electrolyte imbalances. Plan:Will check BMP, iron studies, vitamin B12, and TSH and T4 to assess reversible causes of her cramping and severe itch. I have advised her to generously apply Aquaphor or Eucerin cream over her feet several times per day to help the skin heal and hold better moisture. I have advised that she do her best to stop scratching aggressively at her feet.

## 2022-07-31 NOTE — Progress Notes (Signed)
Internal Medicine Clinic Attending ? ?Case discussed with Dr. Dean  At the time of the visit.  We reviewed the resident?s history and exam and pertinent patient test results.  I agree with the assessment, diagnosis, and plan of care documented in the resident?s note.  ?

## 2022-10-03 ENCOUNTER — Encounter: Payer: Self-pay | Admitting: Student

## 2022-10-03 ENCOUNTER — Other Ambulatory Visit: Payer: Self-pay

## 2022-10-03 ENCOUNTER — Ambulatory Visit (INDEPENDENT_AMBULATORY_CARE_PROVIDER_SITE_OTHER): Payer: BC Managed Care – PPO | Admitting: Student

## 2022-10-03 VITALS — BP 108/62 | HR 71 | Temp 97.7°F | Resp 28 | Ht 65.0 in | Wt 217.3 lb

## 2022-10-03 DIAGNOSIS — E785 Hyperlipidemia, unspecified: Secondary | ICD-10-CM | POA: Diagnosis not present

## 2022-10-03 DIAGNOSIS — Z1211 Encounter for screening for malignant neoplasm of colon: Secondary | ICD-10-CM

## 2022-10-03 DIAGNOSIS — Z8673 Personal history of transient ischemic attack (TIA), and cerebral infarction without residual deficits: Secondary | ICD-10-CM

## 2022-10-03 DIAGNOSIS — R232 Flushing: Secondary | ICD-10-CM | POA: Diagnosis not present

## 2022-10-03 DIAGNOSIS — M519 Unspecified thoracic, thoracolumbar and lumbosacral intervertebral disc disorder: Secondary | ICD-10-CM

## 2022-10-03 DIAGNOSIS — M797 Fibromyalgia: Secondary | ICD-10-CM | POA: Diagnosis not present

## 2022-10-03 MED ORDER — DULOXETINE HCL 60 MG PO CPEP: 60.0000 mg | ORAL_CAPSULE | Freq: Every day | ORAL | 2 refills | Status: AC

## 2022-10-03 MED ORDER — GABAPENTIN 300 MG PO CAPS: 600.0000 mg | ORAL_CAPSULE | Freq: Three times a day (TID) | ORAL | 2 refills | Status: AC

## 2022-10-03 MED ORDER — ATORVASTATIN CALCIUM 40 MG PO TABS: 40.0000 mg | ORAL_TABLET | Freq: Every day | ORAL | 3 refills | Status: DC

## 2022-10-03 NOTE — Progress Notes (Signed)
   CC: f/u HLD, medication refills  HPI:  [name] is a 52 y.o. female with history listed below presenting to the [facility] for f/u HLD, medication refills. Please see individualized problem based charting for full HPI.  Past Medical History:  Diagnosis Date   Anticardiolipin antibody syndrome ([place])    Bipolar affective ([place])    Drug-seeking behavior    Fibromyalgia    Need for immunization against influenza 04/12/2018   Sleep apnea    Stroke ([place])    L sided weakness    Review of Systems:  Negative aside from that listed in individualized problem based charting.  Physical Exam:  Vitals:   10/03/22 0943  BP: 108/62  Pulse: 71  Resp: (!) 28  Temp: 97.7 F (36.5 C)  TempSrc: Oral  SpO2: 98%  Weight: 217 lb 4.8 oz (98.6 kg)  Height: 5\' 5"  (1.651 m)   Physical Exam Constitutional:      Appearance: Normal appearance. She is obese. She is not ill-appearing.  HENT:     Mouth/Throat:     Mouth: Mucous membranes are moist.     Pharynx: Oropharynx is clear.  Eyes:     Extraocular Movements: Extraocular movements intact.     Conjunctiva/sclera: Conjunctivae normal.  Cardiovascular:     Rate and Rhythm: Normal rate and regular rhythm.     Heart sounds: Normal heart sounds. No murmur heard.    No gallop.  Pulmonary:     Effort: Pulmonary effort is normal. No respiratory distress.     Breath sounds: Normal breath sounds.  Abdominal:     General: Bowel sounds are normal. There is no distension.     Palpations: Abdomen is soft.     Tenderness: There is no abdominal tenderness.  Musculoskeletal:        General: Normal range of motion.  Skin:    General: Skin is warm and dry.  Neurological:     Mental Status: She is alert and oriented to person, place, and time. Mental status is at baseline.  Psychiatric:        Mood and Affect: Mood normal.        Behavior: Behavior normal.      Assessment & Plan:   See Encounters Tab for problem based charting.  Patient  discussed with Dr.  Cain Sieve

## 2022-10-03 NOTE — Assessment & Plan Note (Addendum)
Atorvastatin refilled today. Checking lipid panel to ensure LDL at goal <70 given hx of CVA.  Plan: -refilled lipitor 40mg  daily -f/u lipid panel  ADDENDUM: LDL 126, above goal given history of CVA. Discussed results with patient and plan to increase lipitor to 80mg  daily. She is in agreement. Recheck lipid panel in 2-3 months.

## 2022-10-03 NOTE — Assessment & Plan Note (Signed)
Chronic and stable on cymbalta and gabapentin (both refilled today).

## 2022-10-03 NOTE — Patient Instructions (Addendum)
Ms. Lawton,  It was a pleasure seeing you in the clinic today.   I have refilled your medications. I have provided a work note. We are checking your cholesterol level today. Please call the number below to schedule your colonoscopy with the stomach doctors. Please come back in 3 months for your next visit.    [facility] Gastroenterologist in [place], [place] in: [place] Address: [address] [place], [place], [place] [address]   Phone: [phone]

## 2022-10-03 NOTE — Assessment & Plan Note (Signed)
Continue ASA and lipitor (refilled today).

## 2022-10-03 NOTE — Assessment & Plan Note (Signed)
Provided number for  GI to schedule colonoscopy.

## 2022-10-04 LAB — LIPID PANEL
Chol/HDL Ratio: 5.8 ratio — ABNORMAL HIGH (ref 0.0–4.4)
Cholesterol, Total: 214 mg/dL — ABNORMAL HIGH (ref 100–199)
HDL: 37 mg/dL — ABNORMAL LOW (ref >39)
LDL Chol Calc (NIH): 126 mg/dL — ABNORMAL HIGH (ref 0–99)
Triglycerides: 287 mg/dL — ABNORMAL HIGH (ref 0–149)
VLDL Cholesterol Cal: 51 mg/dL — ABNORMAL HIGH (ref 5–40)

## 2022-10-06 MED ORDER — ATORVASTATIN CALCIUM 80 MG PO TABS: 80.0000 mg | ORAL_TABLET | Freq: Every day | ORAL | 1 refills | Status: AC

## 2022-10-06 NOTE — Progress Notes (Signed)
Internal Medicine Clinic Attending  Case discussed with Dr. Allyson Sabal  At the time of the visit.  We reviewed the resident's history and exam and pertinent patient test results.  I agree with the assessment, diagnosis, and plan of care documented in the resident's note.    Since LDL > goal of 70, I recommend increasing Atorvastatin from 40 to 73m daily. Repeat lipids at next visit on the higher dose to see if this is sufficient.

## 2022-10-06 NOTE — Addendum Note (Signed)
Addended by: Virl Axe on: 10/06/2022 01:35 PM   Modules accepted: Orders

## 2022-10-22 ENCOUNTER — Ambulatory Visit (INDEPENDENT_AMBULATORY_CARE_PROVIDER_SITE_OTHER): Payer: Self-pay | Admitting: Student

## 2022-10-22 DIAGNOSIS — U071 COVID-19: Secondary | ICD-10-CM

## 2022-10-22 NOTE — Progress Notes (Unsigned)
This telehealth visit is to complete patient's FMLA form. No charge

## 2022-10-23 ENCOUNTER — Telehealth: Payer: Self-pay

## 2022-10-23 NOTE — Telephone Encounter (Signed)
Patient called she stated she is returning your call.

## 2022-10-24 ENCOUNTER — Telehealth: Payer: Self-pay | Admitting: Student

## 2022-10-24 NOTE — Telephone Encounter (Addendum)
RTC to [employer] placed on hold.  Will need to return call on Monday.

## 2022-10-24 NOTE — Telephone Encounter (Signed)
[employer] is calling at the request of patient, Andrea [name].  States that Dr. Alfonse Spruce has some questions or need more clarity that patient was unable to answer before completing FMLA paperwork.  Forwarding message to PCP and triage nurse to get back in contact with any [employer] representative.

## 2022-11-04 ENCOUNTER — Encounter: Payer: Self-pay | Admitting: Student

## 2022-12-30 ENCOUNTER — Encounter: Payer: BC Managed Care – PPO | Admitting: Student

## 2022-12-31 ENCOUNTER — Encounter: Payer: BC Managed Care – PPO | Admitting: Student

## 2023-01-19 ENCOUNTER — Encounter: Payer: Self-pay | Admitting: *Deleted

## 2023-08-31 ENCOUNTER — Encounter: Payer: BC Managed Care – PPO | Admitting: Student

## 2023-12-29 ENCOUNTER — Ambulatory Visit: Payer: Self-pay | Admitting: Internal Medicine

## 2023-12-29 ENCOUNTER — Encounter: Payer: Self-pay | Admitting: Internal Medicine

## 2023-12-29 NOTE — Progress Notes (Deleted)
   CC: follow up appointment  HPI:  Andrea [name] is a 53 y.o. with medical history of prior stroke with left sided residual weakness, HLD, fibromylagia and lumbar degenerative disc disease presenting to [facility] for a follow up appointment.   Please see problem-based list for further details, assessments, and plans.  Past Medical History:  Diagnosis Date   Anticardiolipin antibody syndrome (HCC)    Bipolar affective (HCC)    Bipolar disorder (HCC) 12/01/2017   Drug-seeking behavior    Encounter for screening colonoscopy 07/30/2022   Fibromyalgia    Need for immunization against influenza 04/12/2018   Sleep apnea    Stroke (HCC)    L sided weakness     Current Outpatient Medications (Cardiovascular):    atorvastatin  (LIPITOR) 80 MG tablet, Take 1 tablet (80 mg total) by mouth daily.     Current Outpatient Medications (Other):    DULoxetine  (CYMBALTA ) 60 MG capsule, Take 1 capsule (60 mg total) by mouth daily.   gabapentin  (NEURONTIN ) 300 MG capsule, Take 2 capsules (600 mg total) by mouth 3 (three) times daily.  Review of Systems:  Review of system negative unless stated in the problem list or HPI.    Physical Exam:  There were no vitals filed for this visit. Physical Exam General: NAD HENT: NCAT Lungs: CTAB, no wheeze, rhonchi or rales.  Cardiovascular: Normal heart sounds, no r/m/g, 2+ pulses in all extremities. No LE edema Abdomen: No TTP, normal bowel sounds MSK: No asymmetry or muscle atrophy.  Skin: no lesions noted on exposed skin Neuro: Alert and oriented x4. CN grossly intact Psych: Normal mood and normal affect   Assessment & Plan:   No problem-specific Assessment & Plan notes found for this encounter.   See Encounters Tab for problem based charting.  Patient Discussed with Dr. {NAMES:[id number]::"Guilloud","Hoffman","Mullen","Narendra","Vincent","Machen","Lau","Hatcher","Williams"} [name], MD [facility]. [facility] Internal Medicine  Residency, PGY-3   DDD   Fibromyalgia/MDD   Care Gaps Mammogram Pna vaccine Colonscopy

## 2023-12-29 NOTE — Progress Notes (Deleted)
   CC: follow up appointment  HPI:  [name] is a 53 y.o. with medical history of prior stroke with left sided residual weakness, HLD, fibromylagia and lumbar degenerative disc disease presenting to [facility] for a follow up appointment.   Please see problem-based list for further details, assessments, and plans.  Past Medical History:  Diagnosis Date   Anticardiolipin antibody syndrome (HCC)    Bipolar affective (HCC)    Drug-seeking behavior    Fibromyalgia    Need for immunization against influenza 04/12/2018   Sleep apnea    Stroke (HCC)    L sided weakness     Current Outpatient Medications (Cardiovascular):    atorvastatin  (LIPITOR) 80 MG tablet, Take 1 tablet (80 mg total) by mouth daily.     Current Outpatient Medications (Other):    DULoxetine  (CYMBALTA ) 60 MG capsule, Take 1 capsule (60 mg total) by mouth daily.   gabapentin  (NEURONTIN ) 300 MG capsule, Take 2 capsules (600 mg total) by mouth 3 (three) times daily.  Review of Systems:  Review of system negative unless stated in the problem list or HPI.    Physical Exam:  There were no vitals filed for this visit. Physical Exam General: NAD HENT: NCAT Lungs: CTAB, no wheeze, rhonchi or rales.  Cardiovascular: Normal heart sounds, no r/m/g, 2+ pulses in all extremities. No LE edema Abdomen: No TTP, normal bowel sounds MSK: No asymmetry or muscle atrophy.  Skin: no lesions noted on exposed skin Neuro: Alert and oriented x4. CN grossly intact Psych: Normal mood and normal affect   Assessment & Plan:   No problem-specific Assessment & Plan notes found for this encounter.   See Encounters Tab for problem based charting.  Patient Discussed with Dr. {NAMES:[id number]::"Guilloud","Hoffman","Mullen","Narendra","Vincent","Machen","Lau","Hatcher","Williams"} [name], MD [facility]. [facility] Internal Medicine Residency, PGY-3   DDD   Fibromyalgia/MDD   Care Gaps Mammogram Pna  vaccine Colonscopy

## 2023-12-30 ENCOUNTER — Ambulatory Visit: Payer: Self-pay | Admitting: Internal Medicine

## 2024-02-08 ENCOUNTER — Encounter: Payer: Self-pay | Admitting: *Deleted

## 2024-03-21 ENCOUNTER — Ambulatory Visit: Payer: Self-pay

## 2024-03-21 NOTE — Assessment & Plan Note (Deleted)
 Last lipid panel 09/2022 cholesterol 214, TG 287, HDL 37, VLDL 51, LDL 126 atorvastatin  80 mg last picked up 10/2022

## 2024-03-21 NOTE — Progress Notes (Deleted)
   Acute Office Visit  Subjective:     Patient ID: Andrea [name], female    DOB: 09/30/1970, 53 y.o.   MRN: [id number]  No chief complaint on file.   HPI Patient is in today for ***  ROS      Objective:    LMP 01/21/2020  {Vitals History (Optional):23777}  Physical Exam  No results found for any visits on 03/21/24.      Assessment & Plan:   Problem List Items Addressed This Visit       Other   Hyperlipidemia - Primary   Other Visit Diagnoses       Screening for diabetes mellitus (DM)           No orders of the defined types were placed in this encounter.   No follow-ups on file.  Aquarius Tremper, DO
